# Patient Record
Sex: Female | Born: 1950 | Race: Black or African American | Hispanic: No | Marital: Married | Smoking: Never smoker
Health system: Southern US, Community
[De-identification: ages and names within clinical notes are randomized; demographics above are authoritative.]

## PROBLEM LIST (undated history)

## (undated) DIAGNOSIS — IMO0001 Reserved for inherently not codable concepts without codable children: Secondary | ICD-10-CM

## (undated) DIAGNOSIS — E785 Hyperlipidemia, unspecified: Secondary | ICD-10-CM

## (undated) DIAGNOSIS — I10 Essential (primary) hypertension: Secondary | ICD-10-CM

## (undated) DIAGNOSIS — K529 Noninfective gastroenteritis and colitis, unspecified: Secondary | ICD-10-CM

## (undated) DIAGNOSIS — K219 Gastro-esophageal reflux disease without esophagitis: Secondary | ICD-10-CM

## (undated) DIAGNOSIS — K859 Acute pancreatitis without necrosis or infection, unspecified: Secondary | ICD-10-CM

## (undated) HISTORY — DX: Acute pancreatitis without necrosis or infection, unspecified: K85.90

## (undated) HISTORY — DX: Reserved for inherently not codable concepts without codable children: IMO0001

## (undated) HISTORY — DX: Noninfective gastroenteritis and colitis, unspecified: K52.9

## (undated) HISTORY — DX: Gastro-esophageal reflux disease without esophagitis: K21.9

## (undated) HISTORY — DX: Hyperlipidemia, unspecified: E78.5

---

## 1998-09-14 ENCOUNTER — Other Ambulatory Visit: Admission: RE | Admit: 1998-09-14 | Discharge: 1998-09-14 | Payer: Self-pay | Admitting: Obstetrics and Gynecology

## 1998-10-25 ENCOUNTER — Ambulatory Visit (HOSPITAL_COMMUNITY): Admission: RE | Admit: 1998-10-25 | Discharge: 1998-10-25 | Payer: Self-pay | Admitting: Obstetrics and Gynecology

## 1998-10-25 ENCOUNTER — Encounter (INDEPENDENT_AMBULATORY_CARE_PROVIDER_SITE_OTHER): Payer: Self-pay

## 2000-09-09 ENCOUNTER — Other Ambulatory Visit: Admission: RE | Admit: 2000-09-09 | Discharge: 2000-09-09 | Payer: Self-pay | Admitting: Obstetrics & Gynecology

## 2001-01-26 ENCOUNTER — Encounter: Admission: RE | Admit: 2001-01-26 | Discharge: 2001-03-27 | Payer: Self-pay | Admitting: Family Medicine

## 2001-03-31 ENCOUNTER — Encounter: Admission: RE | Admit: 2001-03-31 | Discharge: 2001-03-31 | Payer: Self-pay | Admitting: Family Medicine

## 2001-03-31 ENCOUNTER — Encounter: Payer: Self-pay | Admitting: Family Medicine

## 2001-09-21 ENCOUNTER — Inpatient Hospital Stay (HOSPITAL_COMMUNITY): Admission: EM | Admit: 2001-09-21 | Discharge: 2001-09-26 | Payer: Self-pay | Admitting: Emergency Medicine

## 2001-09-21 ENCOUNTER — Encounter: Payer: Self-pay | Admitting: Emergency Medicine

## 2001-09-22 ENCOUNTER — Encounter: Payer: Self-pay | Admitting: General Surgery

## 2001-09-23 ENCOUNTER — Encounter: Payer: Self-pay | Admitting: General Surgery

## 2001-10-09 ENCOUNTER — Encounter: Payer: Self-pay | Admitting: Gastroenterology

## 2001-10-09 ENCOUNTER — Encounter: Admission: RE | Admit: 2001-10-09 | Discharge: 2001-10-09 | Payer: Self-pay | Admitting: Gastroenterology

## 2001-11-18 ENCOUNTER — Other Ambulatory Visit: Admission: RE | Admit: 2001-11-18 | Discharge: 2001-11-18 | Payer: Self-pay | Admitting: Obstetrics & Gynecology

## 2002-08-12 ENCOUNTER — Encounter: Admission: RE | Admit: 2002-08-12 | Discharge: 2002-08-12 | Payer: Self-pay | Admitting: Family Medicine

## 2002-08-12 ENCOUNTER — Encounter: Payer: Self-pay | Admitting: Family Medicine

## 2002-12-08 ENCOUNTER — Other Ambulatory Visit: Admission: RE | Admit: 2002-12-08 | Discharge: 2002-12-08 | Payer: Self-pay | Admitting: Obstetrics & Gynecology

## 2004-01-13 ENCOUNTER — Other Ambulatory Visit: Admission: RE | Admit: 2004-01-13 | Discharge: 2004-01-13 | Payer: Self-pay | Admitting: Obstetrics & Gynecology

## 2004-02-08 ENCOUNTER — Encounter: Admission: RE | Admit: 2004-02-08 | Discharge: 2004-02-08 | Payer: Self-pay | Admitting: Family Medicine

## 2005-01-14 ENCOUNTER — Other Ambulatory Visit: Admission: RE | Admit: 2005-01-14 | Discharge: 2005-01-14 | Payer: Self-pay | Admitting: Obstetrics & Gynecology

## 2005-04-25 ENCOUNTER — Observation Stay (HOSPITAL_COMMUNITY): Admission: EM | Admit: 2005-04-25 | Discharge: 2005-04-26 | Payer: Self-pay | Admitting: Emergency Medicine

## 2005-05-29 ENCOUNTER — Encounter: Admission: RE | Admit: 2005-05-29 | Discharge: 2005-05-29 | Payer: Self-pay | Admitting: Gastroenterology

## 2006-06-13 ENCOUNTER — Encounter: Admission: RE | Admit: 2006-06-13 | Discharge: 2006-06-13 | Payer: Self-pay | Admitting: Obstetrics & Gynecology

## 2006-11-27 ENCOUNTER — Emergency Department (HOSPITAL_COMMUNITY): Admission: EM | Admit: 2006-11-27 | Discharge: 2006-11-27 | Payer: Self-pay | Admitting: Family Medicine

## 2007-10-30 ENCOUNTER — Encounter: Admission: RE | Admit: 2007-10-30 | Discharge: 2007-10-30 | Payer: Self-pay | Admitting: Internal Medicine

## 2007-10-30 IMAGING — CT CT PARANASAL SINUSES LIMITED
1 series · 16 of 24 positions shown, 20 images · non-contrast
Comparison: None

CLINICAL DATA: Persistent symptoms of sinusitis, left sided
headache, left eye pain, vertigo

CT PARANASAL SINUSES WITHOUT CONTRAST
TECHNIQUE: Multidetector CT through the paranasal sinuses was
performed using the standard protocol without intravenous contrast.

[Series 2: limited sinus prone · axial · 0.33mm/px · z∈[+58,+143]mm · 16 of 24 slices shown, 20 images]
[im 2/24  brain]
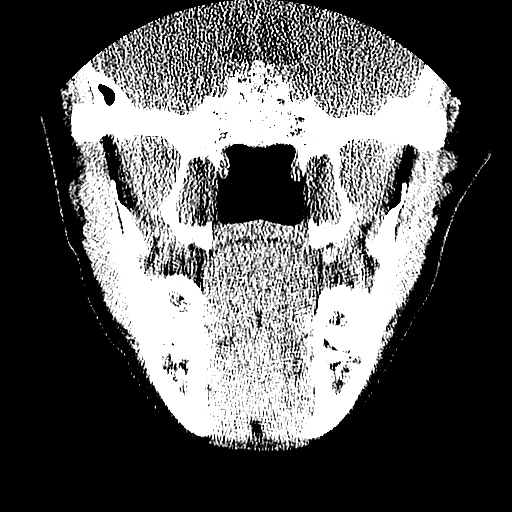
[im 2/24  bone]
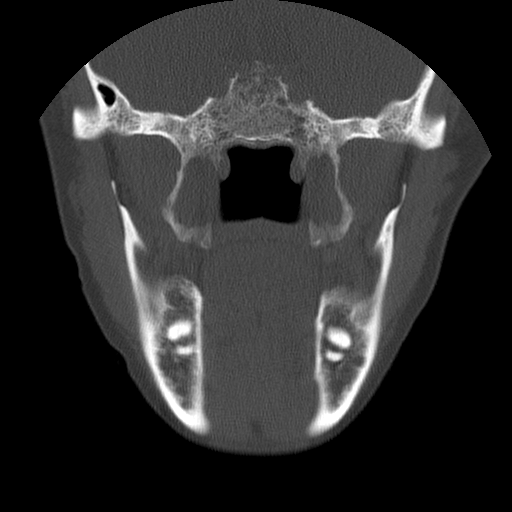
[im 4/24  bone]
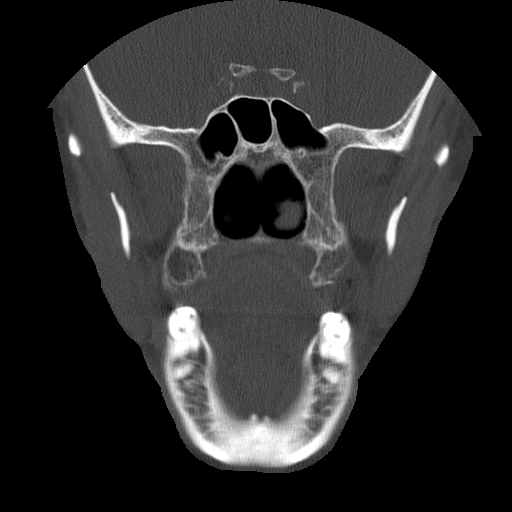
[im 5/24  bone]
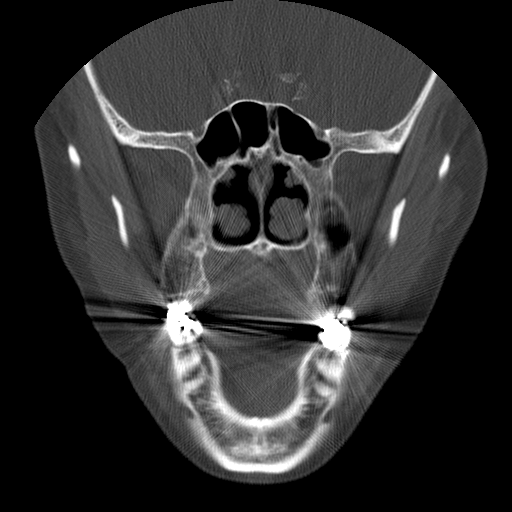
[im 6/24  bone]
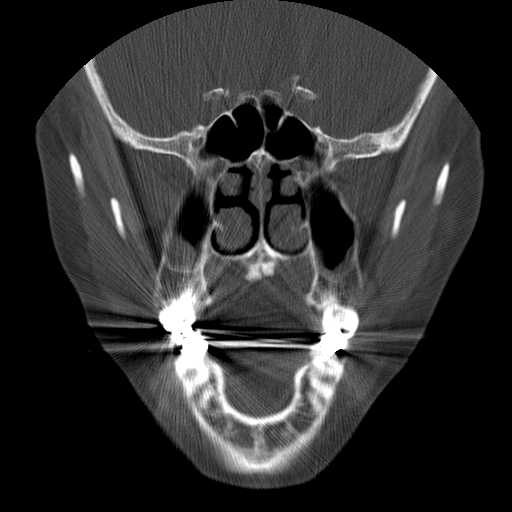
[im 8/24  brain]
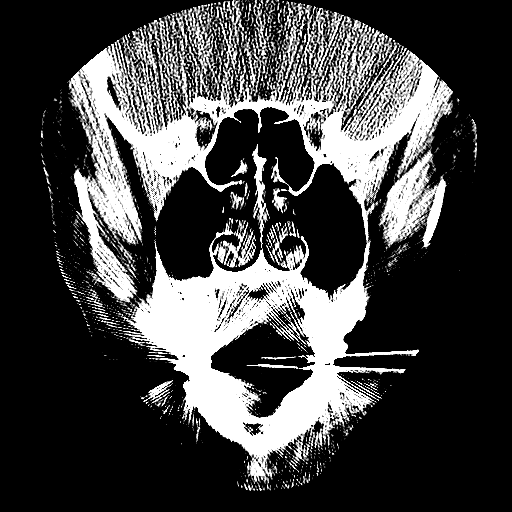
[im 8/24  bone]
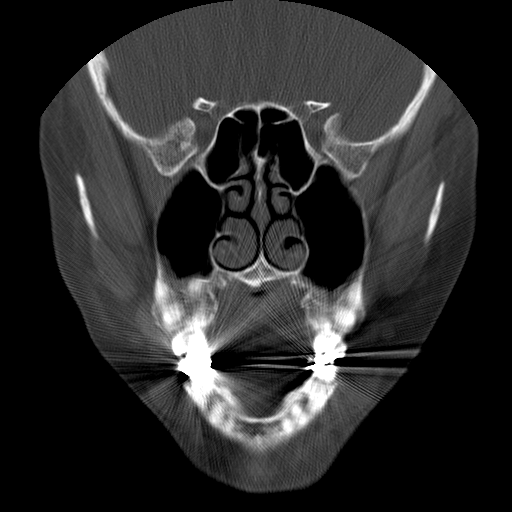
[im 9/24  bone]
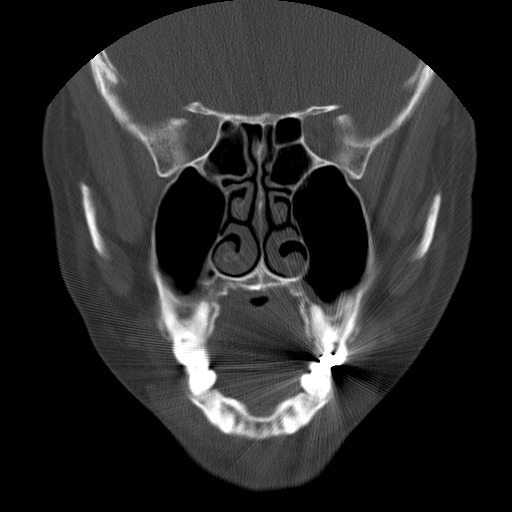
[im 10/24  bone]
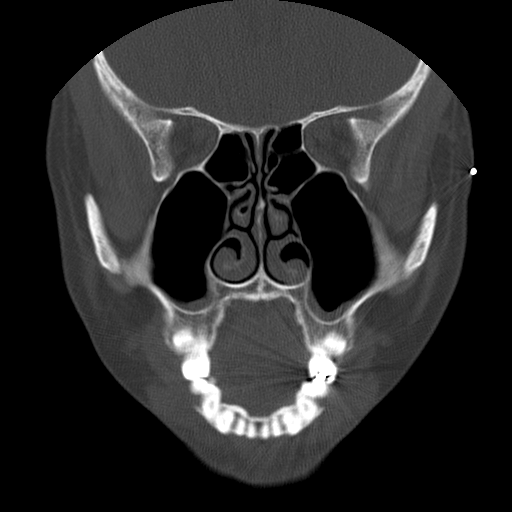
[im 12/24  bone]
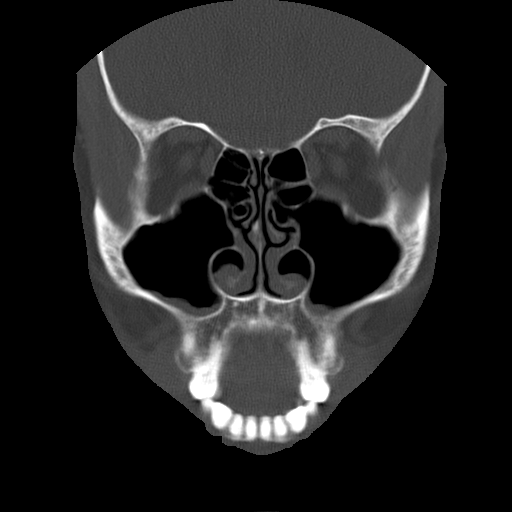
[im 13/24  brain]
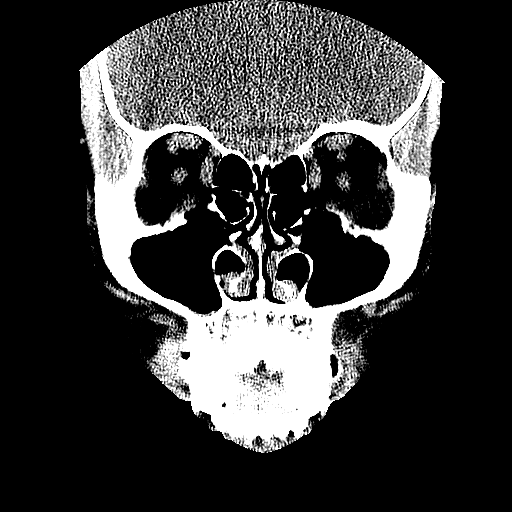
[im 13/24  bone]
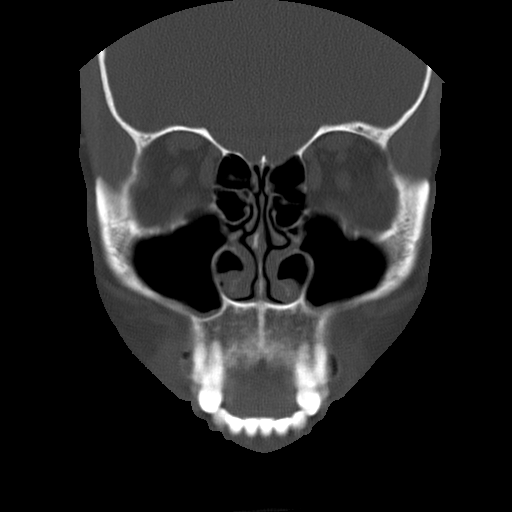
[im 15/24  bone]
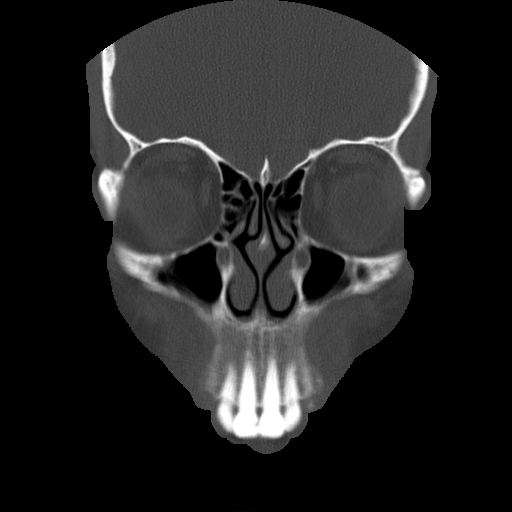
[im 16/24  bone]
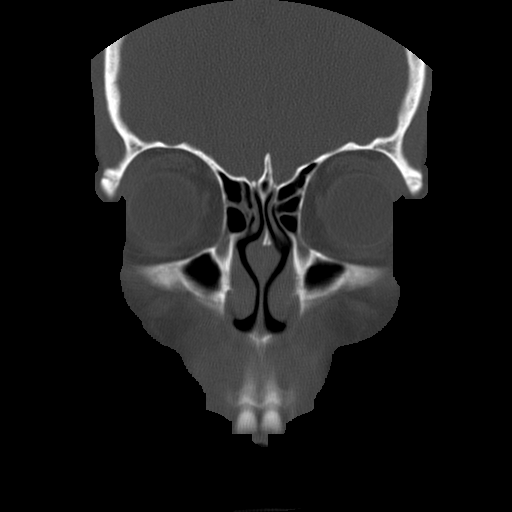
[im 17/24  bone]
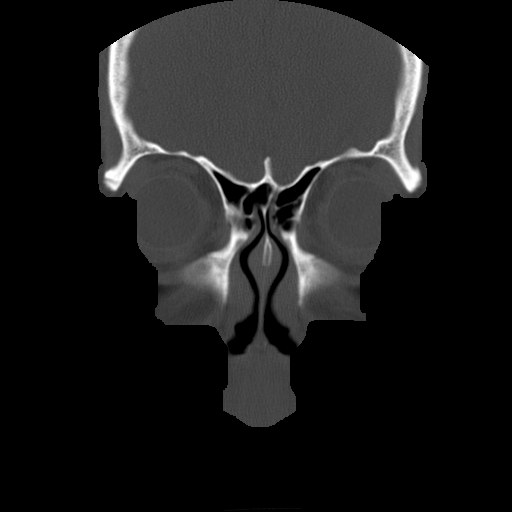
[im 19/24  brain]
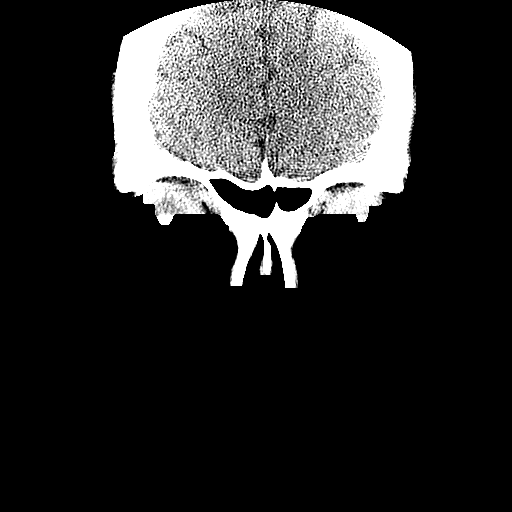
[im 19/24  bone]
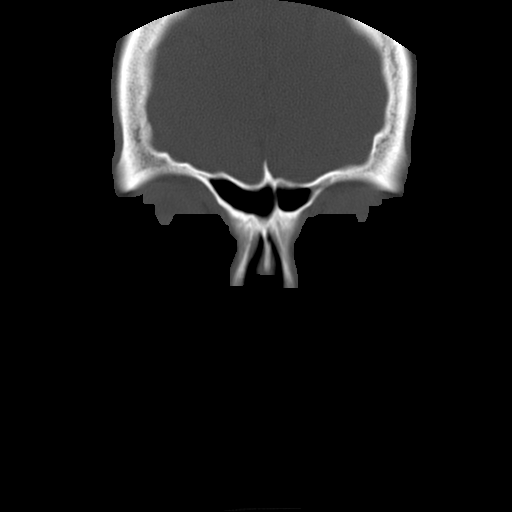
[im 20/24  bone]
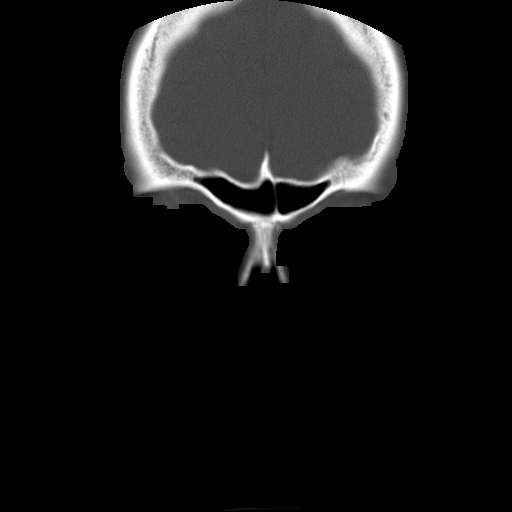
[im 21/24  bone]
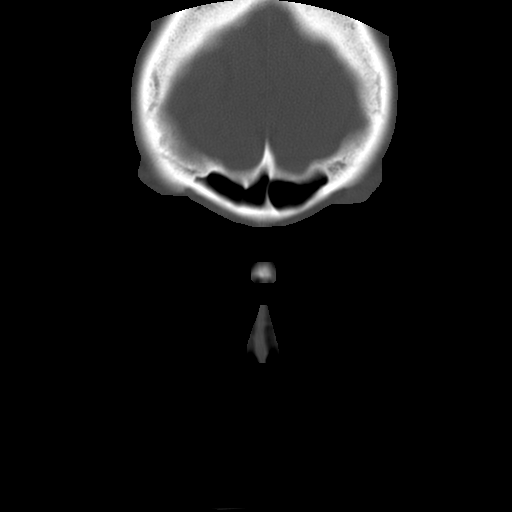
[im 23/24  bone]
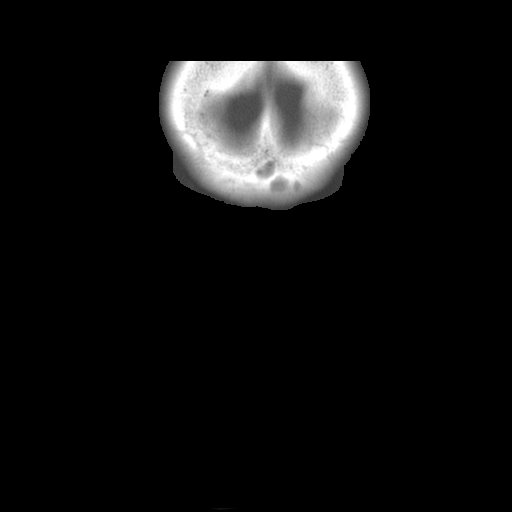

[16 of 24 positions shown; findings below may reference images not displayed]

FINDINGS: There is mild mucosal thickening in the floor of both
maxillary sinuses.  However no air-fluid level is seen.  The
infundibuli are not optimally seen on this limited exam but appear
patent.  The remainder the paranasal sinuses are well pneumatized.
Nasal turbinates are normal in size and the nasal airway is patent.
No significant nasal septal deviation is seen.
IMPRESSION: Mild mucosal thickening in the floor of both maxillary sinuses.  No
present sinusitis.

## 2008-06-27 ENCOUNTER — Emergency Department (HOSPITAL_COMMUNITY): Admission: EM | Admit: 2008-06-27 | Discharge: 2008-06-27 | Payer: Self-pay | Admitting: Family Medicine

## 2010-06-17 ENCOUNTER — Inpatient Hospital Stay (HOSPITAL_COMMUNITY)
Admission: EM | Admit: 2010-06-17 | Discharge: 2010-06-19 | DRG: 551 | Disposition: A | Discharge status: Home Health | Payer: BC Managed Care – PPO | Attending: Internal Medicine | Admitting: Internal Medicine

## 2010-06-17 ENCOUNTER — Emergency Department (HOSPITAL_COMMUNITY): Payer: BC Managed Care – PPO

## 2010-06-17 DIAGNOSIS — I951 Orthostatic hypotension: Secondary | ICD-10-CM | POA: Diagnosis present

## 2010-06-17 DIAGNOSIS — E785 Hyperlipidemia, unspecified: Secondary | ICD-10-CM | POA: Diagnosis present

## 2010-06-17 DIAGNOSIS — Z932 Ileostomy status: Secondary | ICD-10-CM

## 2010-06-17 DIAGNOSIS — A088 Other specified intestinal infections: Principal | ICD-10-CM | POA: Diagnosis present

## 2010-06-17 DIAGNOSIS — E860 Dehydration: Secondary | ICD-10-CM | POA: Diagnosis present

## 2010-06-17 DIAGNOSIS — N179 Acute kidney failure, unspecified: Secondary | ICD-10-CM | POA: Diagnosis present

## 2010-06-17 DIAGNOSIS — I10 Essential (primary) hypertension: Secondary | ICD-10-CM | POA: Diagnosis present

## 2010-06-17 DIAGNOSIS — Z79899 Other long term (current) drug therapy: Secondary | ICD-10-CM

## 2010-06-17 DIAGNOSIS — Z9049 Acquired absence of other specified parts of digestive tract: Secondary | ICD-10-CM

## 2010-06-17 HISTORY — DX: Essential (primary) hypertension: I10

## 2010-06-17 LAB — COMPREHENSIVE METABOLIC PANEL
ALT: 37 U/L — ABNORMAL HIGH (ref 0–35)
Alkaline Phosphatase: 85 U/L (ref 39–117)
BUN: 29 mg/dL — ABNORMAL HIGH (ref 6–23)
Calcium: 10.7 mg/dL — ABNORMAL HIGH (ref 8.4–10.5)
Creatinine, Ser: 2.31 mg/dL — ABNORMAL HIGH (ref 0.4–1.2)
Sodium: 139 mEq/L (ref 135–145)
Total Protein: 9.8 g/dL — ABNORMAL HIGH (ref 6.0–8.3)

## 2010-06-17 LAB — URINALYSIS, ROUTINE W REFLEX MICROSCOPIC
Hgb urine dipstick: NEGATIVE
Protein, ur: 100 mg/dL — AB
Specific Gravity, Urine: 1.027 (ref 1.005–1.030)
Urobilinogen, UA: 0.2 mg/dL (ref 0.0–1.0)
pH: 5.5 (ref 5.0–8.0)

## 2010-06-17 LAB — DIFFERENTIAL
Lymphocytes Relative: 10 % — ABNORMAL LOW (ref 12–46)
Monocytes Absolute: 0.8 10*3/uL (ref 0.1–1.0)
Monocytes Relative: 6 % (ref 3–12)
Neutro Abs: 10.7 10*3/uL — ABNORMAL HIGH (ref 1.7–7.7)
Neutrophils Relative %: 84 % — ABNORMAL HIGH (ref 43–77)

## 2010-06-17 LAB — CBC
HCT: 43.7 % (ref 36.0–46.0)
MCHC: 35.5 g/dL (ref 30.0–36.0)
MCV: 90.3 fL (ref 78.0–100.0)
Platelets: 277 10*3/uL (ref 150–400)
RDW: 13.8 % (ref 11.5–15.5)

## 2010-06-17 LAB — URINE MICROSCOPIC-ADD ON

## 2010-06-18 ENCOUNTER — Inpatient Hospital Stay (HOSPITAL_COMMUNITY): Payer: BC Managed Care – PPO

## 2010-06-18 ENCOUNTER — Encounter (HOSPITAL_COMMUNITY): Payer: Self-pay | Admitting: Radiology

## 2010-06-18 LAB — COMPREHENSIVE METABOLIC PANEL
AST: 32 U/L (ref 0–37)
Calcium: 9.6 mg/dL (ref 8.4–10.5)
Chloride: 109 mEq/L (ref 96–112)
Creatinine, Ser: 2.71 mg/dL — ABNORMAL HIGH (ref 0.4–1.2)
GFR calc non Af Amer: 18 mL/min — ABNORMAL LOW (ref >60)
Sodium: 138 mEq/L (ref 135–145)

## 2010-06-18 LAB — CBC
MCHC: 33.9 g/dL (ref 30.0–36.0)
MCV: 90 fL (ref 78.0–100.0)
RBC: 4.22 MIL/uL (ref 3.87–5.11)
RDW: 14 % (ref 11.5–15.5)
WBC: 7.7 10*3/uL (ref 4.0–10.5)

## 2010-06-18 LAB — CARDIAC PANEL(CRET KIN+CKTOT+MB+TROPI)
CK, MB: 6.8 ng/mL (ref 0.3–4.0)
Relative Index: 3.8 — ABNORMAL HIGH (ref 0.0–2.5)
Total CK: 159 U/L (ref 7–177)
Troponin I: 0.01 ng/mL (ref 0.00–0.06)

## 2010-06-19 LAB — CBC
HCT: 33.6 % — ABNORMAL LOW (ref 36.0–46.0)
Hemoglobin: 11.3 g/dL — ABNORMAL LOW (ref 12.0–15.0)
MCV: 91.3 fL (ref 78.0–100.0)
Platelets: 224 10*3/uL (ref 150–400)
WBC: 6 10*3/uL (ref 4.0–10.5)

## 2010-06-19 LAB — CLOSTRIDIUM DIFFICILE BY PCR: Toxigenic C. Difficile by PCR: NEGATIVE

## 2010-06-19 LAB — BASIC METABOLIC PANEL
BUN: 24 mg/dL — ABNORMAL HIGH (ref 6–23)
Calcium: 8.5 mg/dL (ref 8.4–10.5)
GFR calc Af Amer: >60 mL/min (ref >60)
GFR calc non Af Amer: 51 mL/min — ABNORMAL LOW (ref >60)
Potassium: 4.7 mEq/L (ref 3.5–5.1)
Sodium: 136 mEq/L (ref 135–145)

## 2010-06-20 LAB — GIARDIA/CRYPTOSPORIDIUM SCREEN(EIA)
Cryptosporidium Screen (EIA): NEGATIVE
Giardia Screen - EIA: NEGATIVE

## 2010-06-23 LAB — STOOL CULTURE

## 2010-06-28 NOTE — Discharge Summary (Signed)
NAMEBRYCELYNN, [name]               ACCOUNT NO.:  [id number]  MEDICAL RECORD NO.:  [id number]           PATIENT TYPE:  I  LOCATION:  4714                         FACILITY:  [facility]  PHYSICIAN:  Peggye Pitt, M.D. DATE OF BIRTH:  [birth year]  DATE OF ADMISSION:  06/17/2010 DATE OF DISCHARGE:  06/19/2010                              DISCHARGE SUMMARY   PRIMARY CARE PHYSICIAN:  Cala Bradford R. Renae Gloss, MD  DISCHARGE DIAGNOSES: 1. Nausea, vomiting and increased ileostomy output, likely secondary     to viral gastroenteritis. 2. Syncope secondary to orthostatic hypotension. 3. Orthostatic hypotension. 4. Acute renal failure, resolved. 5. History of ulcerative colitis status post total colectomy in 1984. 6. Hypertension. 7. Hyperlipidemia.  DISCHARGE MEDICATIONS: 1. Calcium carbonate plus vitamin D 1 tablet daily. 2. Crestor 10 mg at bedtime. 3. Nortriptyline 25 mg at bedtime. 4. Progesterone 50 mg capsule 1 tablet twice daily. 5. Protonix 40 mg twice daily. 6. Zyrtec 10 mg daily. 7. She has been instructed to discontinue the use of Candesartan until     seen by Dr. Renae Gloss.  DISPOSITION AND FOLLOWUP:  Natalie Boyd will be discharged home today in stable and improved condition.  She is instructed to drink plenty of fluids and to return to the emergency department if she notes significant increase of her ostomy output.  She also need to schedule a followup appointment with Dr. Renae Gloss in approximately 2 weeks.  CONSULTATION THIS HOSPITALIZATION:  None.  IMAGES AND PROCEDURES: 1. An acute abdominal series on March 4 that showed a nonobstructive     bowel gas pattern with no acute cardiopulmonary disease. 2. A CT scan of the abdomen and pelvis on March 5 that showed no     evidence of acute abdominal process with a low density lesion     within the liver slightly larger than the CT from 2011 but is     likely benign.  HISTORY AND PHYSICAL:  For full details, please see dictation  on March 4 by Dr. Susie Cassette, but in brief, Natalie Boyd is a very pleasant 60 year old African American lady with a history of ulcerative colitis who is status post a total colectomy at [facility] in [place] in 1984 who presented with nausea, vomiting and an increase in her ostomy output for about 2 days prior to admission.  She subsequently had a syncopal episode when she stood up, went to her bathroom and on her way back pass out.  Because of this her husband brought her into the emergency department where she was found to be clinically dehydrated.  HOSPITAL COURSE BY PROBLEM: 1. Acute viral gastroenteritis.  We believe that this is the most     likely diagnosis for her symptoms.  This has resolved spontaneously     within the course of 24 hours in the hospital.  We have given her     copious amounts of IV fluids.  She still remains orthostatic by     pulse parameter and not by blood pressure.  However she has stood     up and walked on the hallways and has no dizziness or  lightheadedness.  She feels ready for discharge home today.  She     will need to follow up with Dr. Renae Gloss in 2 weeks or if her ostomy     output increases significantly. 2. Hypertension.  She was on Atacand.  Given her low blood pressures     currently and the fact that she is still orthostatic by pulse data,     I will discontinue this for now.  Dr. Renae Gloss will make a decision     upon followup appointment whether or not to restart it. 3. Rest of her chronic conditions are stable. 4. Vitals on day of discharge; blood pressure 100/61, heart rate 89,     respirations 20, sats of 100% on room air and a temperature of     97.9.     Peggye Pitt, M.D.     EH/MEDQ  D:  06/19/2010  T:  06/19/2010  Job:  [id number]  cc:   Merlene Laughter. Renae Gloss, M.D.  Electronically Signed by Peggye Pitt M.D. on 06/28/2010 05:42:25 PM

## 2010-07-21 NOTE — H&P (Signed)
Natalie Boyd, Natalie Boyd               ACCOUNT NO.:  [id number]  MEDICAL RECORD NO.:  [id number]           PATIENT TYPE:  E  LOCATION:  [facility]                         FACILITY:  [facility]  PHYSICIAN:  Richarda Overlie, MD       DATE OF BIRTH:  [birth year]  DATE OF ADMISSION:  06/17/2010 DATE OF DISCHARGE:                             HISTORY & PHYSICAL   PRIMARY CARE PHYSICIAN:  Merlene Laughter. Renae Gloss, MD  CHIEF COMPLAINT:  Diarrhea, nausea, vomiting.  SUBJECTIVE:  This is a 60 year old female with a history of ulcerative colitis status post colectomy in 1984 and long-standing ileostomy who has done fairly well who presented with intractable nausea, vomiting, diarrhea starting at around 2-3 p.m. yesterday.  The patient has had multiple episodes of vomiting associated with multiple times when she has had to empty her ileostomy bag.  She typically empties it out 6 times a day, but over the course of the last 24 hours the patient states that she has emptied it out multiple times.  Initially, the stool in the bag was watery, but over the last 6-7 hours it has become completely transparent.  The patient is also clinically dehydrated.  She states that at 12 noon today the patient had a syncopal episode during an episode of nausea, vomiting, and was found on the floor by her husband. She denied any chest pain, dizziness, or any other premonition symptoms prior to the fall.  The patient was found to be quite dehydrated and orthostatic in the ED and is being admitted for further evaluation.  PAST MEDICAL HISTORY:  History of ulcerative colitis, previously managed by [facility] gastroenterologist; however, she relocated in 1990 and was followed by Dr. Chesley Mires, a gastroenterologist in [facility], currently her gastroenterologist is Dr. Ewing Schlein.  She is status post total colectomy in 1984 and has done quite well.  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  History of hypertension, dyslipidemia, status  post tonsillectomy, history of pancreatitis in 1970.  CURRENT MEDICATIONS:  Protonix, promethazine, nortriptyline, Leveron, progesterone, vitamin D, Crestor, and Atacand.  ALLERGIES:  PENICILLIN, SULFA, and CODEINE.  SOCIAL HISTORY:  The patient lives in [place] with her husband. Denies any tobacco or alcohol use.  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  FAMILY HISTORY:  Father died in the 73s with complications from heart disease.  He had a first myocardial infarction at the age of 46.  Mother is alive and well.  REVIEW OF SYSTEMS:  Complete review of systems was done as documented in the HPI.  There are no complaints or history of any fever, chills, or rigors at home.  PHYSICAL EXAMINATION:  VITAL SIGNS:  Blood pressure currently 102/61, pulse of 107, respirations 15.  The patient was also hypotensive in the ED with blood pressure of 78/46 recorded at around 1800. GENERAL:  Currently, the patient is alert, awake, comfortable in no acute cardiopulmonary distress. HEENT:  Pupils equal and reactive.  Extraocular movements intact. NECK:  Supple.  No JVD. LUNGS:  Clear to auscultation bilaterally.  No wheezes or crackles or rhonchi. CARDIOVASCULAR:  Regular rate and rhythm.  No murmurs, rubs, or gallops. ABDOMEN:  Soft, hyperactive bowel sounds with an ileostomy back in the right lower quadrant which is filled with watery, greenish-appearing liquid. NEUROLOGIC:  Cranial nerves II through XII grossly intact. PSYCHIATRIC:  Appropriate mood and affect.  LABORATORY DATA: 1. Abdominal KUB shows nonobstructive bowel gas pattern. 2. Lipase of 24.  CMP, sodium 139, potassium 4.2, chloride 105, bicarb     20, glucose 132, BUN 29, creatinine 2.31.  Total bilirubin is 0.5,     AST of 43, ALT 37, calcium of 10.7. 3. CBC, WBC 12.8, hemoglobin 15.5, hematocrit 43.7, and platelet count     of 277,000.  ASSESSMENT AND PLAN: 1. Likely acute gastroenteritis. 2. Orthostatic hypotension secondary to acute  gastroenteritis. 3. Acute renal insufficiency secondary to dehydration. 4. Syncopal episode likely secondary to significant orthostatic     hypotension.  PLAN:  The patient will be admitted to the telemetry floor.  Given her complicated history of abdominal surgeries, we will obtain a CT scan of the abdomen and pelvis to rule out any other underlying process.  The patient will empirically be started on ciprofloxacin and Flagyl.  We will send off stool studies for C. diff, PCR, ova and parasites, culture and sensitivity.  We will hold her antihypertensive medications and hydrate her aggressively with IV fluids.  The patient's syncopal episode was likely secondary to her significant orthostasis which was quite evident when the patient presented to the ED; however, given her history of hypertension we will place on telemetry.  We will cycle cardiac enzymes.  We will also obtain a 2-D echo.  If the patient's symptoms resolve, she may be discharged in 1-2 days.     Richarda Overlie, MD     NA/MEDQ  D:  06/17/2010  T:  06/17/2010  Job:  [id number]  Electronically Signed by Richarda Overlie MD on 07/21/2010 08:37:12 PM

## 2010-08-31 NOTE — Discharge Summary (Signed)
[facility]  Patient:    Natalie Boyd, Natalie Boyd Visit Number: [id number] MRN: [id number]          Service Type: MED Location: [id number] Attending Physician:  Caleen Essex Dictated by:   Ollen Gross. Vernell Morgans, M.D. Admit Date:  09/21/2001 Discharge Date: 09/26/2001                             Discharge Summary  HISTORY OF PRESENT ILLNESS:  The patient is a 60 year old, African-American female with a history of ulcerative colitis and prior abdominoperineal resection with permanent ileostomy who presented initially with abdominal pain and vomiting.  She had radiologic findings consistent with a partial small bowel obstruction.  HOSPITAL COURSE:  She was treated with bowel rest and hydration.  Over the next several days, she showed some significant improvement.  She was eventually started on a diet and slowly advanced as she tolerated.  By June 14, she was ready for discharge home.  CONDITION ON DISCHARGE:  Stable.  DISCHARGE MEDICATIONS:  Resume home medications.  DIET:  As tolerated.  DISCHARGE DIAGNOSIS:  Partial small bowel obstruction, resolved.  FOLLOWUP:  Follow up as needed with the surgical service and referrals will be made to the gastroenterologist to follow her for ulcerative colitis.  During her hospitalization, she asked to have a second opinion which was done by Dr. Johna Sheriff who agreed with her initial evaluation and assessment.  On June 14, she was discharged to home. Dictated by:   Ollen Gross. Vernell Morgans, M.D. Attending Physician:  Caleen Essex DD:  11/02/01 TD:  11/08/01 Job: [phone] [id number]

## 2010-08-31 NOTE — H&P (Signed)
NAMEJARELIS, Natalie Boyd               ACCOUNT NO.:  [id number]   MEDICAL RECORD NO.:  [id number]          PATIENT TYPE:  INP   LOCATION:  0102                         [facility]ACILITY:  [facility]   PHYSICIAN:  Hettie Holstein, D.O.    DATE OF BIRTH:  08-Aug-1950   DATE OF ADMISSION:  04/25/2005  DATE OF DISCHARGE:                                HISTORY & PHYSICAL   CHIEF COMPLAINT:  Stomach cramping and pain and near-syncopal episode.   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 In any  event, today she was walking to her bathroom at home to empty her ileostomy  bag as she had been having increased ostomy output with very liquid, watery  output and subsequently upon returning she much worsening of her mid  epigastric cramping pain, stated that she was hunched over. Her husband  witnessed the episode and saw her stumbling around in the room and  subsequently she fell onto some pillows on the floor. In any event, he  walked over towards her and was assessing her. Her eyes were open and she  was breathing, though she felt clammy and wet. She was not responsive to  verbal though she was breathing. Her husband went to call his daughter to  make plans to bring her to the hospital. All of this did resolve within 5  minutes. She continued to have the abdominal pain but she did have an  episode of emesis and felt better afterwards.   In any event, in the emergency department she was assessed, underwent  ultrasound of the abdomen which was unrevealing. She was noted to have some  mildly elevated LFTs in the department with an AST of 78 and an ALT of 66.  All other laboratory data was  normal.   PAST MEDICAL HISTORY:  1.  As noted above, significant for ulcerative colitis, previously managed      by [facility] gastroenterologist; however, she has relocated here      since 1990 and has followed by Dr. Steele Sizer, a gastroenterologist at      [facility]. She has had an endoscopy in August and has not been      reported abnormal findings. These records are not available at this time      as they are at [facility]. She did have a CT scan through her primary      care physician in October that was without IV contrast and normal      results were conveyed to her. She was told she had a pancreatic rest      though I am not certain of this diagnosis at this time.  2.  History of hypertension.  3.  Hypercholesterolemia.  4.  Status post tonsillectomy.  5.  History  of pancreatitis in the 1970s that she attributed to Azulfidine      that she was receiving for her ulcerative colitis.   MEDICATIONS:  1.  Carafate t.i.d. to q.i.d.  2.  Protonix 40 mg daily.  3.  Lipitor 20 mg daily.  4.  Atacand - she is uncertain of the dose but feels it may be 16 mg daily.   ALLERGIES:  Include PENICILLIN and SULFA.   SOCIAL HISTORY:  Originally she is from [place]. They have one adopted  child. She denies tobacco or alcohol. 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   FAMILY HISTORY:  Her father died in his 33s with heart complications. He had  his first myocardial infarction at age 33. Mother is alive and well at age  22.   REVIEW OF SYSTEMS:  She had some liquid ileostomy output with increased  output recently. She has had no fevers, does report some chills. She has had  no chest pain or shortness of breath. Has had some occasional swelling of  her lower extremities. No dysuria. Otherwise, further review of systems is  unremarkable.   PHYSICAL EXAMINATION:  VITAL SIGNS:  Revealed a temperature of 98.4, blood  pressure of 102/68, heart rate of 66, respirations 20, O2 saturation 97%.  GENERAL:  The  patient is alert, oriented, pleasant, quite articulate, in no  acute distress.  NECK:  Supple, nontender. No palpable thyromegaly or mass.  CARDIOVASCULAR:  Reveals normal S1, S2, without S3 or S4.  LUNGS:  Clear to auscultation bilaterally with normal effort and no dullness  to percussion.  ABDOMEN:  Reveals a midline surgical incision and ileostomy with cloudy  output, scant. Abdomen reveals no rebound or rigidity. Her bowel sounds are  hypoactive.  EXTREMITIES:  Reveal no edema.  NEUROLOGIC:  Reveals the patient to be euthymic, her affect is stable. There  are no focal neurologic deficits.   LABORATORY DATA:  Reveal her INR to 1.1. Lipase 35. Sodium 136, potassium  4.0, BUN 11, creatinine of 1.2, glucose of 94. Her AST was 78 and ALT was  66, albumin was 3.9. Her MCV was 92. Abdominal ultrasound was normal. EKG  revealed normal sinus rhythm.   ASSESSMENT:  1.  Intractable abdominal pain.  2.  Status post colectomy with ileostomy.  3.  Elevated liver function tests.  4.  Hypertension.   PLAN:  At this time we are going to order a CT scan on Natalie Boyd to  evaluate the etiology of her complaints. She will be placed on 23-hour  observation status and placed on clear liquids. She is currently  asymptomatic at this time.  We will administer antiemetics and antispasmodics for now, hold her Atacand  and Lipitor as her pressures are on the low side. We will follow her  clinical course. Perhaps she needs a local gastroenterologist as she states  she would be open to this as hers is at [facility].      Hettie Holstein, D.O.  Electronically Signed     ESS/MEDQ  D:  04/25/2005  T:  04/25/2005  Job:  [id number]   cc:   Dr. Steele Sizer at [facility] R. Renae Gloss, M.D.  Fax: [phone]   Petra Kuba, M.D.  Fax: [phone]

## 2010-08-31 NOTE — Discharge Summary (Signed)
Natalie Boyd, Natalie Boyd               ACCOUNT NO.:  [id number]   MEDICAL RECORD NO.:  [id number]          PATIENT TYPE:  INP   LOCATION:  1621                         [facility]ACILITY[facility]   PHYSICIAN:  Nelma Rothman, MD   DATE OF BIRTH:  [birth year]   DATE OF ADMISSION:  04/25/2005  DATE OF DISCHARGE:  04/26/2005                                 DISCHARGE SUMMARY   PRIMARY CARE PHYSICIAN:  Dr. Andi Devon   DISCHARGE DIAGNOSES:  1.  Acute gastroenteritis.  2.  Ulcerative colitis, status post colectomy  3.  History of hyperlipidemia.   PROCEDURES:  1.  Abdominal ultrasound on January11 demonstrated no acute or significant      findings.  Her gallbladder and biliary tree were normal.  Liver, spleen,      and pancreas were also within normal limits.  2.  A CT of the abdomen and pelvis with contrast on January11 demonstrated      no acute abdominal findings status post colectomy.  There were some      small low density liver lesions noted inferiorly in the right lobe of      the liver but were present on a prior abdominal CT from June2003 and are      unchanged.  They are likely deposits of local fat or benign lesions per      radiology report.  No suspicious liver lesions.   LABORATORY DATA:  White blood cell count 4.4, hemoglobin 13.3, hematocrit  39.4, and platelet count 225.  On the morning of discharge, sodium 138,  potassium 4.2, creatinine 1.1.  Liver function tests on admission  demonstrated total bilirubin of 0.3, alkaline phosphatase 129.  AST was  mildly elevated at 78.  ALT was mildly elevated at 66, lipase 35, and TSH  within normal limits at 1.504.   HISTORY AND PHYSICAL:  Please see dictated admission history and physical  but briefly, Natalie Boyd is a 60 year old female with a history of ulcerative  colitis status post colectomy, who presented to the [facility] emergency  department with crampy abdominal pain, watery output from her ileostomy.   HOSPITAL COURSE:   Natalie Boyd was admitted to the hospital to the medical  unit for IV fluid rehydration and treated for presumed acute  gastroenteritis.  An abdominal ultrasound which was performed was completely  normal.  Also contrasted of the abdomen and pelvis was within normal limits.  The morning after admission, her abdominal pain had subsided, but she  complained of some burning in her throat, thought to be consistent with  reflux disease.  The patient had previously been instructed that she is  unable to take over-the-counter antacids secondary to their magnesium  content.  I have therefore suggested to her that she increase her Protonix  to twice daily until she follows up with her regular GI doctor.  On the  morning of discharge, she remained afebrile, and vital signs were within  normal limits.  Her electrolytes were also within normal limits.  She was  felt stable for discharge to home with follow-up with her primary care  physician next week as well as her GI doctor.  I have also given her the  phone number for the GI clinic here in case she wants a second opinion, as  she seems quite frustrated overall that no one can give her an etiology of  her continued GI complaints.  Specifically, she has been having occasional  abdominal pain for at least a year as well as problems with recurrent  reflux.  She states that this has been evaluated previously by a  gastroenterologist over at [facility], and workup has been nonrevealing.  Given  her history of ulcerative colitis, there was some consideration as to  whether or not she could have a component of Crohn disease.  However, she  has remained afebrile.  There has been no bloody output from her ileostomy.  Therefore, this acute episode was felt more consistent with gastroenteritis,  which is already resolving.  She was felt stable for discharge today with  close continued follow up with her GI doctor as well as her primary care  physician.   DISCHARGE  MEDICATIONS:  She is to resume all previous home medications with  the exception of increasing Protonix to 40 mg twice daily and will follow up  with her GI doctor.   DISCHARGE INSTRUCTIONS:  She will return to follow up with Dr. Renae Gloss  within the next week.  She will hold her Lipitor until that time secondary  to a mild transaminitis.  She should have her liver function tests rechecked  at Dr. Mathews Robinsons office next week to determine whether or not she should  restart taking a statin.      Nelma Rothman, MD  Electronically Signed     RAR/MEDQ  D:  04/26/2005  T:  04/28/2005  Job:  [id number]

## 2010-12-24 ENCOUNTER — Other Ambulatory Visit: Payer: Self-pay | Admitting: Obstetrics & Gynecology

## 2010-12-24 ENCOUNTER — Other Ambulatory Visit (HOSPITAL_BASED_OUTPATIENT_CLINIC_OR_DEPARTMENT_OTHER): Payer: Self-pay | Admitting: Obstetrics & Gynecology

## 2010-12-24 DIAGNOSIS — N644 Mastodynia: Secondary | ICD-10-CM

## 2010-12-26 ENCOUNTER — Other Ambulatory Visit: Payer: Self-pay | Admitting: Obstetrics & Gynecology

## 2010-12-26 ENCOUNTER — Ambulatory Visit
Admission: RE | Admit: 2010-12-26 | Discharge: 2010-12-26 | Disposition: A | Discharge status: Home / Self Care | Payer: BC Managed Care – PPO | Source: Ambulatory Visit | Attending: Obstetrics & Gynecology | Admitting: Obstetrics & Gynecology

## 2010-12-26 DIAGNOSIS — N644 Mastodynia: Secondary | ICD-10-CM

## 2012-04-16 ENCOUNTER — Other Ambulatory Visit: Payer: Self-pay | Admitting: Otolaryngology

## 2012-04-16 DIAGNOSIS — J329 Chronic sinusitis, unspecified: Secondary | ICD-10-CM

## 2012-04-21 ENCOUNTER — Ambulatory Visit
Admission: RE | Admit: 2012-04-21 | Discharge: 2012-04-21 | Disposition: A | Discharge status: Home / Self Care | Payer: BC Managed Care – PPO | Source: Ambulatory Visit | Attending: Otolaryngology | Admitting: Otolaryngology

## 2012-04-21 DIAGNOSIS — J329 Chronic sinusitis, unspecified: Secondary | ICD-10-CM

## 2012-04-30 ENCOUNTER — Other Ambulatory Visit: Payer: Self-pay | Admitting: Otolaryngology

## 2012-05-06 ENCOUNTER — Other Ambulatory Visit: Payer: Self-pay

## 2013-04-01 ENCOUNTER — Encounter: Payer: Self-pay | Admitting: Podiatrist

## 2013-04-01 ENCOUNTER — Ambulatory Visit (INDEPENDENT_AMBULATORY_CARE_PROVIDER_SITE_OTHER): Payer: BC Managed Care – PPO | Admitting: Podiatrist

## 2013-04-01 VITALS — BP 112/60 | HR 84 | Resp 12

## 2013-04-01 DIAGNOSIS — Q828 Other specified congenital malformations of skin: Secondary | ICD-10-CM

## 2013-04-01 NOTE — Progress Notes (Signed)
   Subjective:    Patient ID: Natalie Boyd, female    DOB: [birth year], 62 y.o.   MRN: [id number]  HPI Comments: LT FOOT GREAT TOENAIL IS HURTING ABOUT 5 MONTHS AND TRIED TO GET IT TRIM. BOTH FEET HAVE CALLUS AND NEED TO BE TRIM.''  patient presents today complaining about pain the left great toenail its currently been bothering her for about 5 months. She also has calluses bilateral feet which she also states needs to be trimmed. She saw Dr. Wynelle Cleveland extensively in the past.   Review of Systems  All other systems reviewed and are negative.       Objective:   Physical Exam Neurovascular status is intact. She has discomfort on the medial aspect the left great toenail the toenail itself resting against the skin on the medial aspect of the toenail and the cuticle is slightly thickened. She also has a Pinch callus on bilateral hallux. Submetatarsal 2 are deeply enucleated lesions with a ground glass appearance present consistent with poro keratoses. She also has a scar along the right fifth digit laterally her previous incision was created. In the past Dr. Wynelle Cleveland tried injection therapy with cortisone which was of no benefit.        Assessment & Plan:  Keratotic/porokeratotic lesions x2,  Plan: Excised the lesions with a #15 blade without complication. Recommended mederma p.m. for the scar on the fifth digit right hopefully this will help the discomfort. Also recommended some cuticle softener for the medial aspect of the toenail as the cuticle itself appears to be thick versus the nail itself being issue.

## 2013-04-01 NOTE — Patient Instructions (Signed)
Try Mederma PM for the scar on the baby toe of the right foot.  Also try cuticle remover along the edges of your toenail to help soften the skin there.

## 2013-05-28 ENCOUNTER — Other Ambulatory Visit: Payer: Self-pay | Admitting: Internal Medicine

## 2013-05-28 DIAGNOSIS — E049 Nontoxic goiter, unspecified: Secondary | ICD-10-CM

## 2013-05-31 ENCOUNTER — Ambulatory Visit
Admission: RE | Admit: 2013-05-31 | Discharge: 2013-05-31 | Disposition: A | Discharge status: Home / Self Care | Payer: BC Managed Care – PPO | Source: Ambulatory Visit | Attending: Internal Medicine | Admitting: Internal Medicine

## 2013-05-31 DIAGNOSIS — E049 Nontoxic goiter, unspecified: Secondary | ICD-10-CM

## 2013-07-28 ENCOUNTER — Other Ambulatory Visit: Payer: Self-pay | Admitting: Otolaryngology

## 2013-07-28 DIAGNOSIS — E041 Nontoxic single thyroid nodule: Secondary | ICD-10-CM

## 2013-08-04 ENCOUNTER — Ambulatory Visit
Admission: RE | Admit: 2013-08-04 | Discharge: 2013-08-04 | Disposition: A | Discharge status: Home / Self Care | Payer: BC Managed Care – PPO | Source: Ambulatory Visit | Attending: Otolaryngology | Admitting: Otolaryngology

## 2013-08-04 ENCOUNTER — Other Ambulatory Visit (HOSPITAL_COMMUNITY)
Admission: RE | Admit: 2013-08-04 | Discharge: 2013-08-04 | Disposition: A | Discharge status: Home / Self Care | Payer: BC Managed Care – PPO | Source: Ambulatory Visit | Attending: Diagnostic Radiology | Admitting: Diagnostic Radiology

## 2013-08-04 DIAGNOSIS — E041 Nontoxic single thyroid nodule: Secondary | ICD-10-CM

## 2013-09-29 ENCOUNTER — Other Ambulatory Visit: Payer: Self-pay | Admitting: Physical Medicine and Rehabilitation

## 2013-09-29 DIAGNOSIS — M62838 Other muscle spasm: Secondary | ICD-10-CM

## 2013-09-29 DIAGNOSIS — G894 Chronic pain syndrome: Secondary | ICD-10-CM

## 2013-09-29 DIAGNOSIS — M5412 Radiculopathy, cervical region: Secondary | ICD-10-CM

## 2013-09-29 DIAGNOSIS — M542 Cervicalgia: Secondary | ICD-10-CM

## 2013-10-06 ENCOUNTER — Ambulatory Visit
Admission: RE | Admit: 2013-10-06 | Discharge: 2013-10-06 | Disposition: A | Discharge status: Home / Self Care | Payer: BC Managed Care – PPO | Source: Ambulatory Visit | Attending: Physical Medicine and Rehabilitation | Admitting: Physical Medicine and Rehabilitation

## 2013-10-06 DIAGNOSIS — M542 Cervicalgia: Secondary | ICD-10-CM

## 2013-10-06 DIAGNOSIS — M62838 Other muscle spasm: Secondary | ICD-10-CM

## 2013-10-06 DIAGNOSIS — M5412 Radiculopathy, cervical region: Secondary | ICD-10-CM

## 2013-10-06 DIAGNOSIS — G894 Chronic pain syndrome: Secondary | ICD-10-CM

## 2014-06-13 ENCOUNTER — Encounter (HOSPITAL_COMMUNITY): Payer: Self-pay | Admitting: Family Medicine

## 2014-06-13 ENCOUNTER — Emergency Department (HOSPITAL_COMMUNITY): Payer: BC Managed Care – PPO

## 2014-06-13 ENCOUNTER — Emergency Department (HOSPITAL_COMMUNITY)
Admission: EM | Admit: 2014-06-13 | Discharge: 2014-06-13 | Disposition: A | Discharge status: Home / Self Care | Payer: BC Managed Care – PPO | Attending: Emergency Medicine | Admitting: Emergency Medicine

## 2014-06-13 DIAGNOSIS — R1110 Vomiting, unspecified: Secondary | ICD-10-CM | POA: Diagnosis not present

## 2014-06-13 DIAGNOSIS — Z880 Allergy status to penicillin: Secondary | ICD-10-CM | POA: Insufficient documentation

## 2014-06-13 DIAGNOSIS — K219 Gastro-esophageal reflux disease without esophagitis: Secondary | ICD-10-CM | POA: Insufficient documentation

## 2014-06-13 DIAGNOSIS — Z8639 Personal history of other endocrine, nutritional and metabolic disease: Secondary | ICD-10-CM | POA: Insufficient documentation

## 2014-06-13 DIAGNOSIS — R1011 Right upper quadrant pain: Secondary | ICD-10-CM | POA: Diagnosis present

## 2014-06-13 DIAGNOSIS — R1013 Epigastric pain: Secondary | ICD-10-CM | POA: Diagnosis not present

## 2014-06-13 DIAGNOSIS — I10 Essential (primary) hypertension: Secondary | ICD-10-CM | POA: Diagnosis not present

## 2014-06-13 LAB — COMPREHENSIVE METABOLIC PANEL
ALBUMIN: 4.1 g/dL (ref 3.5–5.2)
ALT: 26 U/L (ref 0–35)
AST: 36 U/L (ref 0–37)
Alkaline Phosphatase: 83 U/L (ref 39–117)
Anion gap: 7 (ref 5–15)
BILIRUBIN TOTAL: 0.1 mg/dL — AB (ref 0.3–1.2)
BUN: 27 mg/dL — ABNORMAL HIGH (ref 6–23)
CO2: 27 mmol/L (ref 19–32)
Calcium: 10.1 mg/dL (ref 8.4–10.5)
Chloride: 104 mmol/L (ref 96–112)
Creatinine, Ser: 1.16 mg/dL — ABNORMAL HIGH (ref 0.50–1.10)
GFR calc Af Amer: 57 mL/min — ABNORMAL LOW (ref >90)
GFR calc non Af Amer: 49 mL/min — ABNORMAL LOW (ref >90)
Glucose, Bld: 104 mg/dL — ABNORMAL HIGH (ref 70–99)
POTASSIUM: 4.1 mmol/L (ref 3.5–5.1)
SODIUM: 138 mmol/L (ref 135–145)
Total Protein: 8 g/dL (ref 6.0–8.3)

## 2014-06-13 LAB — CBC WITH DIFFERENTIAL/PLATELET
BASOS PCT: 0 % (ref 0–1)
Basophils Absolute: 0 10*3/uL (ref 0.0–0.1)
Eosinophils Absolute: 0.1 10*3/uL (ref 0.0–0.7)
Eosinophils Relative: 1 % (ref 0–5)
HCT: 37.8 % (ref 36.0–46.0)
Hemoglobin: 12.9 g/dL (ref 12.0–15.0)
LYMPHS ABS: 1 10*3/uL (ref 0.7–4.0)
Lymphocytes Relative: 8 % — ABNORMAL LOW (ref 12–46)
MCH: 30.8 pg (ref 26.0–34.0)
MCHC: 34.1 g/dL (ref 30.0–36.0)
MCV: 90.2 fL (ref 78.0–100.0)
MONOS PCT: 7 % (ref 3–12)
Monocytes Absolute: 0.8 10*3/uL (ref 0.1–1.0)
NEUTROS ABS: 10.3 10*3/uL — AB (ref 1.7–7.7)
Neutrophils Relative %: 84 % — ABNORMAL HIGH (ref 43–77)
Platelets: 243 10*3/uL (ref 150–400)
RBC: 4.19 MIL/uL (ref 3.87–5.11)
RDW: 13.5 % (ref 11.5–15.5)
WBC: 12.1 10*3/uL — ABNORMAL HIGH (ref 4.0–10.5)

## 2014-06-13 LAB — URINALYSIS, ROUTINE W REFLEX MICROSCOPIC
Bilirubin Urine: NEGATIVE
Glucose, UA: NEGATIVE mg/dL
Hgb urine dipstick: NEGATIVE
Ketones, ur: 15 mg/dL — AB
Leukocytes, UA: NEGATIVE
Nitrite: NEGATIVE
PH: 5 (ref 5.0–8.0)
PROTEIN: NEGATIVE mg/dL
Specific Gravity, Urine: 1.023 (ref 1.005–1.030)
Urobilinogen, UA: 0.2 mg/dL (ref 0.0–1.0)

## 2014-06-13 LAB — LIPASE, BLOOD: Lipase: 41 U/L (ref 11–59)

## 2014-06-13 LAB — I-STAT CG4 LACTIC ACID, ED: LACTIC ACID, VENOUS: 1.35 mmol/L (ref 0.5–2.0)

## 2014-06-13 MED ORDER — ONDANSETRON HCL 4 MG/2ML IJ SOLN
4.0000 mg | Freq: Once | INTRAMUSCULAR | Status: AC
  Administered 2014-06-13: 4 mg via INTRAVENOUS
  Filled 2014-06-13: qty 2

## 2014-06-13 MED ORDER — ONDANSETRON 4 MG PO TBDP: 4.0000 mg | ORAL_TABLET | Freq: Three times a day (TID) | ORAL | Status: DC | PRN

## 2014-06-13 MED ORDER — ALUM & MAG HYDROXIDE-SIMETH 200-200-20 MG/5ML PO SUSP
30.0000 mL | Freq: Once | ORAL | Status: AC
  Administered 2014-06-13: 30 mL via ORAL
  Filled 2014-06-13: qty 30

## 2014-06-13 MED ORDER — SODIUM CHLORIDE 0.9 % IV BOLUS (SEPSIS)
1000.0000 mL | Freq: Once | INTRAVENOUS | Status: AC
  Administered 2014-06-13: 1000 mL via INTRAVENOUS

## 2014-06-13 NOTE — ED Notes (Signed)
Patient transported to Ultrasound 

## 2014-06-13 NOTE — Discharge Instructions (Signed)
Abdominal Pain, Women Natalie Boyd, your ultrasound did not show any problems with your liver or gallbladder.  Your symptoms are likely from reflux or heartburn.  You can consider taking zantac, protonix, or maalox for relief.  Also avoiding spicy foods can help.  Follow up with your gastroenterologist for further recommendations.  If any symptoms worsen, come back to the ED immediately.  Thank you. Abdominal (stomach, pelvic, or belly) pain can be caused by many things. It is important to tell your doctor:  The location of the pain.  Does it come and go or is it present all the time?  Are there things that start the pain (eating certain foods, exercise)?  Are there other symptoms associated with the pain (fever, nausea, vomiting, diarrhea)? All of this is helpful to know when trying to find the cause of the pain. CAUSES   Stomach: virus or bacteria infection, or ulcer.  Intestine: appendicitis (inflamed appendix), regional ileitis (Crohn's disease), ulcerative colitis (inflamed colon), irritable bowel syndrome, diverticulitis (inflamed diverticulum of the colon), or cancer of the stomach or intestine.  Gallbladder disease or stones in the gallbladder.  Kidney disease, kidney stones, or infection.  Pancreas infection or cancer.  Fibromyalgia (pain disorder).  Diseases of the female organs:  Uterus: fibroid (non-cancerous) tumors or infection.  Fallopian tubes: infection or tubal pregnancy.  Ovary: cysts or tumors.  Pelvic adhesions (scar tissue).  Endometriosis (uterus lining tissue growing in the pelvis and on the pelvic organs).  Pelvic congestion syndrome (female organs filling up with blood just before the menstrual period).  Pain with the menstrual period.  Pain with ovulation (producing an egg).  Pain with an IUD (intrauterine device, birth control) in the uterus.  Cancer of the female organs.  Functional pain (pain not caused by a disease, may improve without  treatment).  Psychological pain.  Depression. DIAGNOSIS  Your doctor will decide the seriousness of your pain by doing an examination.  Blood tests.  X-rays.  Ultrasound.  CT scan (computed tomography, special type of X-ray).  MRI (magnetic resonance imaging).  Cultures, for infection.  Barium enema (dye inserted in the large intestine, to better view it with X-rays).  Colonoscopy (looking in intestine with a lighted tube).  Laparoscopy (minor surgery, looking in abdomen with a lighted tube).  Major abdominal exploratory surgery (looking in abdomen with a large incision). TREATMENT  The treatment will depend on the cause of the pain.   Many cases can be observed and treated at home.  Over-the-counter medicines recommended by your caregiver.  Prescription medicine.  Antibiotics, for infection.  Birth control pills, for painful periods or for ovulation pain.  Hormone treatment, for endometriosis.  Nerve blocking injections.  Physical therapy.  Antidepressants.  Counseling with a psychologist or psychiatrist.  Minor or major surgery. HOME CARE INSTRUCTIONS   Do not take laxatives, unless directed by your caregiver.  Take over-the-counter pain medicine only if ordered by your caregiver. Do not take aspirin because it can cause an upset stomach or bleeding.  Try a clear liquid diet (broth or water) as ordered by your caregiver. Slowly move to a bland diet, as tolerated, if the pain is related to the stomach or intestine.  Have a thermometer and take your temperature several times a day, and record it.  Bed rest and sleep, if it helps the pain.  Avoid sexual intercourse, if it causes pain.  Avoid stressful situations.  Keep your follow-up appointments and tests, as your caregiver orders.  If the  pain does not go away with medicine or surgery, you may try:  Acupuncture.  Relaxation exercises (yoga, meditation).  Group therapy.  Counseling. SEEK  MEDICAL CARE IF:   You notice certain foods cause stomach pain.  Your home care treatment is not helping your pain.  You need stronger pain medicine.  You want your IUD removed.  You feel faint or lightheaded.  You develop nausea and vomiting.  You develop a rash.  You are having side effects or an allergy to your medicine. SEEK IMMEDIATE MEDICAL CARE IF:   Your pain does not go away or gets worse.  You have a fever.  Your pain is felt only in portions of the abdomen. The right side could possibly be appendicitis. The left lower portion of the abdomen could be colitis or diverticulitis.  You are passing blood in your stools (bright red or black tarry stools, with or without vomiting).  You have blood in your urine.  You develop chills, with or without a fever.  You pass out. MAKE SURE YOU:   Understand these instructions.  Will watch your condition.  Will get help right away if you are not doing well or get worse. Document Released: 01/27/2007 Document Revised: 08/16/2013 Document Reviewed: 02/16/2009 [facility] Patient Information 2015 [place], [place]. This information is not intended to replace advice given to you by your health care provider. Make sure you discuss any questions you have with your health care provider. Gastritis, Adult Gastritis is soreness and puffiness (inflammation) of the lining of the stomach. If you do not get help, gastritis can cause bleeding and sores (ulcers) in the stomach. HOME CARE   Only take medicine as told by your doctor.  If you were given antibiotic medicines, take them as told. Finish the medicines even if you start to feel better.  Drink enough fluids to keep your pee (urine) clear or pale yellow.  Avoid foods and drinks that make your problems worse. Foods you may want to avoid include:  Caffeine or alcohol.  Chocolate.  Mint.  Garlic and onions.  Spicy foods.  Citrus fruits, including oranges, lemons, or  limes.  Food containing tomatoes, including sauce, chili, salsa, and pizza.  Fried and fatty foods.  Eat small meals throughout the day instead of large meals. GET HELP RIGHT AWAY IF:   You have black or dark red poop (stools).  You throw up (vomit) blood. It may look like coffee grounds.  You cannot keep fluids down.  Your belly (abdominal) pain gets worse.  You have a fever.  You do not feel better after 1 week.  You have any other questions or concerns. MAKE SURE YOU:   Understand these instructions.  Will watch your condition.  Will get help right away if you are not doing well or get worse. Document Released: 09/18/2007 Document Revised: 06/24/2011 Document Reviewed: 05/15/2011 [facility] Patient Information 2015 [place], [place]. This information is not intended to replace advice given to you by your health care provider. Make sure you discuss any questions you have with your health care provider.

## 2014-06-13 NOTE — ED Provider Notes (Addendum)
CSN: [id number]     Arrival date & time 06/13/14  0418 History   First MD Initiated Contact with Patient 06/13/14 0421     Chief Complaint  Patient presents with  . Abdominal Pain  . Emesis     (Consider location/radiation/quality/duration/timing/severity/associated sxs/prior Treatment) HPI Natalie Boyd is a 64 y.o. 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 Patient states her symptoms began 2 days ago with nausea. Nothing makes the symptoms better or worse. This evening she then had right upper quadrant pain with multiple episodes of emesis. Over the last 2 days she's noticed the output from her ileostomy bag has become increasingly watery. She denies fevers or recent infections. 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 She denies any urinary symptoms. Patient has no further complaints.  10 Systems reviewed and are negative for acute change except as noted in the HPI.     Past Medical History  Diagnosis Date  . Hypertension   . Hyperlipidemia   . Colitis   . Pancreatitis   . Reflux    History reviewed. No pertinent past surgical history. History reviewed. No pertinent family history. History  Substance Use Topics  . Smoking status: Never Smoker   . Smokeless tobacco: Not on file  . Alcohol Use: No   OB History    No data available     Review of Systems    Allergies  Codeine; Doxycycline; Erythromycin; Penicillins; and Sulfa antibiotics  Home Medications   Prior to Admission medications   Medication Sig Start Date End Date Taking? Authorizing Provider  azithromycin (ZITHROMAX) 250 MG tablet  12/31/12   Historical Provider, MD  candesartan (ATACAND) 16 MG tablet  03/10/13   Historical Provider, MD  CRESTOR 10 MG tablet  03/20/13   Historical Provider, MD  Dermatological Products,  [facility]) lotion  03/10/13   Historical Provider, MD  levofloxacin (LEVAQUIN) 500 MG tablet  01/19/13   Historical Provider, MD  methocarbamol (ROBAXIN) 750 MG tablet  03/25/13   Historical Provider, MD  montelukast (SINGULAIR) 10 MG tablet  03/20/13   Historical Provider, MD  NASONEX 50 MCG/ACT nasal spray  01/19/13   Historical Provider, MD  nortriptyline (PAMELOR) 25 MG capsule  03/10/13   Historical Provider, MD  pantoprazole (PROTONIX) 40 MG tablet  03/30/13   Historical Provider, MD  predniSONE (DELTASONE) 10 MG tablet  01/19/13   Historical Provider, MD  triamcinolone ointment (KENALOG) 0.1 %  03/01/13   Historical Provider, MD   BP 103/68 mmHg  Pulse 104  Temp(Src) 97.7 F (36.5 C) (Oral)  Resp 18  Ht 5\' 7"  (1.702 m)  Wt 143 lb (64.864 kg)  BMI 22.39 kg/m2  SpO2 100% Physical Exam  Constitutional: She is oriented to person, place, and time. She appears well-developed and well-nourished. No distress.  HENT:  Head: Normocephalic and atraumatic.  Nose: Nose normal.  Mouth/Throat: Oropharynx is clear and moist. No oropharyngeal exudate.  Eyes: Conjunctivae and EOM are normal. Pupils are equal, round, and reactive to light. No scleral icterus.  Neck: Normal range of motion. Neck supple. No JVD present. No tracheal deviation present. No thyromegaly present.  Cardiovascular: Normal rate, regular rhythm and normal heart sounds.  Exam reveals no gallop and no friction rub.   No murmur heard. Pulmonary/Chest: Effort normal and breath sounds normal. No respiratory distress. She has no wheezes. She  exhibits no tenderness.  Abdominal: Soft. Bowel sounds are normal. She exhibits no distension and no mass. There is tenderness. There is no rebound and no guarding.  TTP RUQ midepigastric  Musculoskeletal: Normal range of motion. She exhibits no edema or tenderness.  Lymphadenopathy:    She has no cervical adenopathy.  Neurological: She is alert and oriented to person, place, and time. No  cranial nerve deficit. She exhibits normal muscle tone.  Skin: Skin is warm and dry. No rash noted. No erythema. No pallor.  Nursing note and vitals reviewed.   ED Course  Procedures (including critical care time) Labs Review Labs Reviewed  CBC WITH DIFFERENTIAL/PLATELET - Abnormal; Notable for the following:    WBC 12.1 (*)    Neutrophils Relative % 84 (*)    Neutro Abs 10.3 (*)    Lymphocytes Relative 8 (*)    All other components within normal limits  COMPREHENSIVE METABOLIC PANEL - Abnormal; Notable for the following:    Glucose, Bld 104 (*)    BUN 27 (*)    Creatinine, Ser 1.16 (*)    Total Bilirubin 0.1 (*)    GFR calc non Af Amer 49 (*)    GFR calc Af Amer 57 (*)    All other components within normal limits  LIPASE, BLOOD  URINALYSIS, ROUTINE W REFLEX MICROSCOPIC  I-STAT CG4 LACTIC ACID, ED    Imaging Review US Abdomen Limited  06/13/2014   CLINICAL DATA:  Right upper quadrant abdominal pain  EXAM: US ABDOMEN LIMITED - RIGHT UPPER QUADRANT  COMPARISON:  04/25/2005  FINDINGS: Gallbladder:  No gallstones or wall thickening visualized. No sonographic Murphy sign noted.  Common bile duct:  Diameter: 4 mm  Liver:  No focal lesion identified. Within normal limits in parenchymal echogenicity. There is antegrade flow in the imaged portal venous system.  IMPRESSION: Negative right upper quadrant ultrasound.   Electronically Signed   By: Marnee Spring M.D.   On: 06/13/2014 05:47     EKG Interpretation   Date/Time:  Monday June 13 2014 04:36:49 EST Ventricular Rate:  97 PR Interval:  149 QRS Duration: 76 QT Interval:  346 QTC Calculation: 439 R Axis:   82 Text Interpretation:  Sinus rhythm Biatrial enlargement Borderline right  axis deviation No significant change since last tracing Confirmed by Erroll Luna [phone]) on 06/13/2014 4:50:25 AM      MDM   Final diagnoses:  RUQ pain    She presents emergency department for abdominal pain. On exam she does  have mild right upper quadrant and midepigastric tenderness to palpation. Will obtain ultrasound to evaluate for gallbladder pathology. She was given Zofran to relieve her symptoms, she is not requesting pain medication at this time. She was also given 1 L IV fluids for tachycardia. Lab results do not show an acute cause for her pain, ultrasound is negative in the right upper quadrant. Repeat evaluation the patient reveals no abdominal tenderness. Her heart rate has come down to the 90s. Upon further questioning patient states she recently stopped taking Protonix due to the rising concerns of its complications as a proton pump inhibitor. She states that her pain is consistent with her prior gastritis. I discussed options with her including Zantac, which she states does not work, resuming Protonix, and using Maalox. Patient will also call her gastroenterologist tomorrow for further recommendations. At this time her vital signs remain within her normal limits and she is safe for discharge.    Tomasita Crumble, MD  06/13/14 0929  Tomasita Crumble, MD 06/21/14 1439

## 2014-06-13 NOTE — ED Notes (Signed)
Pt here for diarrhea, vomiting and nausea since yesterday. sts some upper abdominal pain.

## 2014-06-23 ENCOUNTER — Encounter: Payer: Self-pay | Admitting: Podiatry

## 2014-06-23 ENCOUNTER — Ambulatory Visit (INDEPENDENT_AMBULATORY_CARE_PROVIDER_SITE_OTHER): Payer: BC Managed Care – PPO | Admitting: Podiatry

## 2014-06-23 DIAGNOSIS — L84 Corns and callosities: Secondary | ICD-10-CM

## 2014-06-23 NOTE — Progress Notes (Signed)
She presents today for a chief complaint of painful calluses plantar aspect of the bilateral foot distal aspect of the second toes and pain to the callused area of the fifth digit right foot.  Objective: Vital signs are stable alert and oriented 3. Pulses are strongly palpable bilateral. Neurologic sensorium is intact. The tendon reflexes are intact bilaterally muscle strength +5 over 5 dorsiflexion plantar flexors and inverters everters on physical musculature is intact. Orthopedic evaluation does demonstrates all joints distal to the ankle full range of motion without crepitation. Cutaneous evaluation demonstrates supple well-hydrated cutis with exception of reactive hyperkeratosis distal aspects of the second toes bilaterally and subsecond metatarsophalangeal joints bilaterally. She also has a lesion to the lateral aspect of the fifth digit right foot. I see no signs of complications.  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  Plan: Debrided all reactive hyperkeratosis and will follow up with her on an as-needed basis.

## 2014-09-15 ENCOUNTER — Ambulatory Visit: Payer: BC Managed Care – PPO | Admitting: Podiatry

## 2014-09-29 ENCOUNTER — Encounter: Payer: Self-pay | Admitting: Podiatry

## 2014-09-29 ENCOUNTER — Ambulatory Visit (INDEPENDENT_AMBULATORY_CARE_PROVIDER_SITE_OTHER): Payer: BC Managed Care – PPO | Admitting: Podiatry

## 2014-09-29 VITALS — BP 115/72 | HR 88 | Resp 12

## 2014-09-29 DIAGNOSIS — L84 Corns and callosities: Secondary | ICD-10-CM | POA: Diagnosis not present

## 2014-09-29 NOTE — Progress Notes (Signed)
She presents today for a follow-up of her bilateral calluses. She states they're doing better however they grow back so fast and she would like to have them trimmed more frequently.  Objective: Vital signs are stable she is alert and oriented 3. Areas of porokeratosis to the plantar aspect of the second metatarsal bilateral as well as the tips of the second toes bilateral. She also has a poor keratoma located overlying the fifth metatarsophalangeal joint and the proximal phalanx of the fifth toe.  Assessment: Causing calluses bilateral.  Plan: Debridement of all reactive hyperkeratotic lesions of porokeratotic lesions bilateral. Follow up with her in a couple of months

## 2014-12-01 ENCOUNTER — Encounter: Payer: Self-pay | Admitting: Podiatry

## 2014-12-01 ENCOUNTER — Ambulatory Visit (INDEPENDENT_AMBULATORY_CARE_PROVIDER_SITE_OTHER): Payer: BC Managed Care – PPO | Admitting: Podiatry

## 2014-12-01 DIAGNOSIS — L84 Corns and callosities: Secondary | ICD-10-CM | POA: Diagnosis not present

## 2014-12-01 NOTE — Progress Notes (Signed)
She presents today with chief complaint of painful calluses bilateral foot. She denies any trauma and states that she's noticed no bleeding or open wounds.    Objective: vital signs are stable alert and oriented 3. Pulses are strongly palpable. Neurologic sensorium is intact percent was seen monofilament. Deep tendon reflexes are intact bilateral and muscle strength is 5 over 5 dorsiflexors plantar flexors and inverters fevers onto the musculatures intact. Orthopedic evaluation demonstrates pes planus with hammertoe deformities bilateral. Cutaneous evaluation x-rays of the well-hydrated cutis bilateral distal clavus to the second digits bilateral. Tyloma plantar aspect of the forefoot bilateral. She also has a corn or porokeratotic lesion overlying the PIPJ fifth digit right foot. None of these wounds are open but all were debrided of their thick tissues today without iatrogenic lesions.  Assessment: pain-free secondary to tyloma is an porokeratosis with hammertoe deformities.  Plan: debridement of all reactive hyperkeratoses for her today with follow-up with her 2 months.

## 2015-01-18 ENCOUNTER — Other Ambulatory Visit: Payer: Self-pay | Admitting: Internal Medicine

## 2015-01-18 DIAGNOSIS — E041 Nontoxic single thyroid nodule: Secondary | ICD-10-CM

## 2015-01-20 ENCOUNTER — Ambulatory Visit
Admission: RE | Admit: 2015-01-20 | Discharge: 2015-01-20 | Disposition: A | Discharge status: Home / Self Care | Payer: BC Managed Care – PPO | Source: Ambulatory Visit | Attending: Internal Medicine | Admitting: Internal Medicine

## 2015-01-20 DIAGNOSIS — E041 Nontoxic single thyroid nodule: Secondary | ICD-10-CM

## 2015-01-31 ENCOUNTER — Ambulatory Visit: Payer: BC Managed Care – PPO | Admitting: Podiatry

## 2015-04-05 ENCOUNTER — Other Ambulatory Visit: Payer: Self-pay | Admitting: Gastroenterology

## 2015-04-05 DIAGNOSIS — R1084 Generalized abdominal pain: Secondary | ICD-10-CM

## 2015-04-18 ENCOUNTER — Inpatient Hospital Stay: Admission: RE | Admit: 2015-04-18 | Payer: BC Managed Care – PPO | Source: Ambulatory Visit

## 2015-04-19 ENCOUNTER — Ambulatory Visit
Admission: RE | Admit: 2015-04-19 | Discharge: 2015-04-19 | Disposition: A | Discharge status: Home / Self Care | Payer: BC Managed Care – PPO | Source: Ambulatory Visit | Attending: Gastroenterology | Admitting: Gastroenterology

## 2015-04-19 ENCOUNTER — Other Ambulatory Visit: Payer: Self-pay | Admitting: Gastroenterology

## 2015-04-19 DIAGNOSIS — R1084 Generalized abdominal pain: Secondary | ICD-10-CM

## 2015-11-07 ENCOUNTER — Ambulatory Visit (INDEPENDENT_AMBULATORY_CARE_PROVIDER_SITE_OTHER): Payer: BC Managed Care – PPO | Admitting: Podiatry

## 2015-11-07 ENCOUNTER — Encounter: Payer: Self-pay | Admitting: Podiatry

## 2015-11-07 VITALS — BP 127/77 | HR 84 | Resp 16

## 2015-11-07 DIAGNOSIS — Q828 Other specified congenital malformations of skin: Secondary | ICD-10-CM | POA: Diagnosis not present

## 2015-11-07 DIAGNOSIS — L84 Corns and callosities: Secondary | ICD-10-CM | POA: Diagnosis not present

## 2015-11-08 NOTE — Progress Notes (Signed)
She presents today with chief complaint of painful thickened calluses to the plantar aspect of the bilateral foot area  Objective: Vital signs are stable alert and oriented 3. Pulses are palpable. Neurologic sensorium is intact mild hammertoe deformity second bilateral resulting in a plantar flexed second metatarsals and reactive hyperkeratosis. No other major cutaneous abnormalities no open lesions or wounds.  Assessment: Tyloma isn't porokeratosis plantar aspect of the bilateral foot and fifth digit of the right foot.  Plan: Debridement of all reactive hyperkeratosis today follow-up with her as needed.

## 2016-02-13 ENCOUNTER — Other Ambulatory Visit: Payer: Self-pay | Admitting: Internal Medicine

## 2016-02-13 DIAGNOSIS — E2839 Other primary ovarian failure: Secondary | ICD-10-CM

## 2016-03-06 ENCOUNTER — Ambulatory Visit
Admission: RE | Admit: 2016-03-06 | Discharge: 2016-03-06 | Disposition: A | Discharge status: Home / Self Care | Payer: BC Managed Care – PPO | Source: Ambulatory Visit | Attending: Internal Medicine | Admitting: Internal Medicine

## 2016-03-06 DIAGNOSIS — E2839 Other primary ovarian failure: Secondary | ICD-10-CM

## 2016-03-26 ENCOUNTER — Ambulatory Visit: Payer: BC Managed Care – PPO | Admitting: Podiatry

## 2016-03-28 ENCOUNTER — Encounter: Payer: Self-pay | Admitting: Podiatry

## 2016-03-28 ENCOUNTER — Ambulatory Visit (INDEPENDENT_AMBULATORY_CARE_PROVIDER_SITE_OTHER): Payer: BC Managed Care – PPO | Admitting: Podiatry

## 2016-03-28 DIAGNOSIS — L600 Ingrowing nail: Secondary | ICD-10-CM | POA: Diagnosis not present

## 2016-03-28 DIAGNOSIS — Q828 Other specified congenital malformations of skin: Secondary | ICD-10-CM | POA: Diagnosis not present

## 2016-03-28 MED ORDER — NEOMYCIN-POLYMYXIN-HC 3.5-10000-1 OT SOLN: OTIC | 0 refills | Status: DC

## 2016-03-28 NOTE — Patient Instructions (Signed)

## 2016-03-31 NOTE — Progress Notes (Signed)
She presents today with a chief complaint of a painful ingrown toenail to the right hallux. She states it's been bother me writing. She refers to the tibial border hallux right. She also has porokeratosis that she like for me to trim.  Objective: Vital signs are stable she is alert and oriented 3 pulses are palpable. Neurologic sensorium is intact. Deep tendon reflexes are intact. Muscle strength is normal bilateral. Cutaneous evaluation and stretched sharply rated now worse on the tibial border of the hallux right. No erythema edema cellulitis drainage or odor. Sharp radial nail margin with thickening of the skin along the border. This is consistent with ingrown toenail. Just has porokeratosis plantar aspect of the forefoot bilateral secondary to long plantar flexed second and third metatarsals.  Assessment: Limb secondary to ingrown nail hallux right. Porokeratosis bilateral.  Plan: Debridement of all reactive hyperkeratosis. Also performed a chemical matricectomy to the tibial border of the hallux right after local anesthesia was induced. She understands this and is amenable to it I also provided with her with both oral and written home-going instructions for care and soaking of the toe. I will follow up with her when she returns from her trip and she will continue so during that time. She is provided with both oral and home-going instructions for care and soaking over toe as well as a prescription for Cortisporin Otic to be applied twice daily.

## 2016-04-18 ENCOUNTER — Ambulatory Visit: Payer: BC Managed Care – PPO | Admitting: Podiatry

## 2016-04-25 ENCOUNTER — Encounter: Payer: Self-pay | Admitting: Podiatry

## 2016-04-25 ENCOUNTER — Ambulatory Visit (INDEPENDENT_AMBULATORY_CARE_PROVIDER_SITE_OTHER): Payer: Self-pay | Admitting: Podiatry

## 2016-04-25 DIAGNOSIS — L600 Ingrowing nail: Secondary | ICD-10-CM

## 2016-04-25 NOTE — Patient Instructions (Signed)

## 2016-04-25 NOTE — Progress Notes (Signed)
She presents today for follow-up of a nail procedure hallux right. She states it is still sore. She states that she's been sick and was unable to soak it. She states that it looks much better is not as painful but it is still sore.  Objective: Vital signs are stable she is alert and oriented 3. Pulses are palpable. Tibial border of the hallux right demonstrates mild postinflammatory hyperpigmentation no erythema edema cellulitis drainage or odor. Minimal pain on palpation.  Assessment: Well-healing surgical toe tibial border hallux right.  Plan: I encouraged her to continue to soak Epsom salts and warm water soak until completely resolved notify me with questions or concerns.

## 2016-05-22 ENCOUNTER — Telehealth: Payer: Self-pay | Admitting: *Deleted

## 2016-05-22 NOTE — Telephone Encounter (Signed)
Pt states she had her toenails trimmed and they still hurt, should she get her manicure. I called pt and she said it was all right she had decided not to go, and had made an appt to be seen.

## 2016-05-28 ENCOUNTER — Encounter: Payer: Self-pay | Admitting: Podiatry

## 2016-05-28 ENCOUNTER — Ambulatory Visit (INDEPENDENT_AMBULATORY_CARE_PROVIDER_SITE_OTHER): Payer: BC Managed Care – PPO | Admitting: Podiatry

## 2016-05-28 DIAGNOSIS — Q828 Other specified congenital malformations of skin: Secondary | ICD-10-CM

## 2016-05-28 DIAGNOSIS — L84 Corns and callosities: Secondary | ICD-10-CM | POA: Diagnosis not present

## 2016-05-29 NOTE — Progress Notes (Signed)
She presents today for follow-up of painful porokeratotic lesions and painful elongated nails bilaterally.  Objective: Vital signs are stable she is alert and oriented 3. Pulses are palpable. Her matricectomy sized on the healed very nicely she has considerable amount of dry skin callus skin in the area which were debrided for her today. This made this much more comfortable she also has reactive hyperkeratosis subsecond metatarsophalangeal joint bilaterally or keratoma to the fifth metatarsal area of the right foot and the second toes bilaterally. Nails are long thick yellow dystrophic.  Assessment: Pain in limb secondary to nail dystrophy onychomycosis and porokeratosis.  Plan: Debridement of all reactive hyperkeratotic tissue debridement of nails.

## 2016-07-31 ENCOUNTER — Other Ambulatory Visit: Payer: Self-pay | Admitting: Obstetrics & Gynecology

## 2016-08-02 LAB — CYTOLOGY - PAP

## 2017-03-05 ENCOUNTER — Other Ambulatory Visit: Payer: Self-pay | Admitting: Internal Medicine

## 2017-03-05 DIAGNOSIS — M5442 Lumbago with sciatica, left side: Secondary | ICD-10-CM

## 2017-03-15 ENCOUNTER — Other Ambulatory Visit: Payer: BC Managed Care – PPO

## 2017-03-24 ENCOUNTER — Other Ambulatory Visit: Payer: BC Managed Care – PPO

## 2017-03-26 ENCOUNTER — Ambulatory Visit
Admission: RE | Admit: 2017-03-26 | Discharge: 2017-03-26 | Disposition: A | Discharge status: Home / Self Care | Payer: BC Managed Care – PPO | Source: Ambulatory Visit | Attending: Internal Medicine | Admitting: Internal Medicine

## 2017-03-26 DIAGNOSIS — M5442 Lumbago with sciatica, left side: Secondary | ICD-10-CM

## 2017-10-22 ENCOUNTER — Encounter (HOSPITAL_COMMUNITY): Payer: Self-pay

## 2017-10-22 ENCOUNTER — Emergency Department (HOSPITAL_COMMUNITY)
Admission: EM | Admit: 2017-10-22 | Discharge: 2017-10-22 | Disposition: A | Discharge status: Home / Self Care | Payer: BC Managed Care – PPO | Attending: Emergency Medicine | Admitting: Emergency Medicine

## 2017-10-22 ENCOUNTER — Emergency Department (HOSPITAL_COMMUNITY): Payer: BC Managed Care – PPO

## 2017-10-22 ENCOUNTER — Other Ambulatory Visit: Payer: Self-pay

## 2017-10-22 DIAGNOSIS — Z79899 Other long term (current) drug therapy: Secondary | ICD-10-CM | POA: Insufficient documentation

## 2017-10-22 DIAGNOSIS — R51 Headache: Secondary | ICD-10-CM | POA: Insufficient documentation

## 2017-10-22 DIAGNOSIS — S161XXA Strain of muscle, fascia and tendon at neck level, initial encounter: Secondary | ICD-10-CM | POA: Diagnosis not present

## 2017-10-22 DIAGNOSIS — I10 Essential (primary) hypertension: Secondary | ICD-10-CM | POA: Diagnosis not present

## 2017-10-22 DIAGNOSIS — Y939 Activity, unspecified: Secondary | ICD-10-CM | POA: Diagnosis not present

## 2017-10-22 DIAGNOSIS — Y999 Unspecified external cause status: Secondary | ICD-10-CM | POA: Insufficient documentation

## 2017-10-22 DIAGNOSIS — S199XXA Unspecified injury of neck, initial encounter: Secondary | ICD-10-CM | POA: Diagnosis present

## 2017-10-22 DIAGNOSIS — Y929 Unspecified place or not applicable: Secondary | ICD-10-CM | POA: Diagnosis not present

## 2017-10-22 MED ORDER — NAPROXEN 500 MG PO TABS: 500.0000 mg | ORAL_TABLET | Freq: Two times a day (BID) | ORAL | 0 refills | Status: DC
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
## 2017-10-22 NOTE — Discharge Instructions (Addendum)
Take naproxen 2 times a day with meals.  Do not take other anti-inflammatories at the same time open (Advil, Motrin, ibuprofen, Aleve). You may supplement with Tylenol if you need further pain control. Use muscle creams (salonpas, icy hot, bengay) for pain control.  Use heating pads to help control your pain. You qill likely have continued muscle stiffness and soreness over the next couple days.  Follow-up with primary care next week if your symptoms are not improving. Return to the emergency room if you develop vision changes, vomiting, slurred speech, numbness, loss of bowel or bladder control, or any new or worsening symptoms.

## 2017-12-02 ENCOUNTER — Ambulatory Visit: Payer: BC Managed Care – PPO | Admitting: Neurology

## 2018-01-27 ENCOUNTER — Encounter (HOSPITAL_COMMUNITY): Payer: Self-pay | Admitting: Emergency Medicine

## 2018-01-27 ENCOUNTER — Emergency Department (HOSPITAL_COMMUNITY): Payer: BC Managed Care – PPO

## 2018-01-27 ENCOUNTER — Observation Stay (HOSPITAL_COMMUNITY)
Admission: EM | Admit: 2018-01-27 | Discharge: 2018-01-28 | Disposition: A | Discharge status: Home / Self Care | Payer: BC Managed Care – PPO | Attending: Internal Medicine | Admitting: Internal Medicine

## 2018-01-27 DIAGNOSIS — M069 Rheumatoid arthritis, unspecified: Secondary | ICD-10-CM | POA: Diagnosis not present

## 2018-01-27 DIAGNOSIS — I10 Essential (primary) hypertension: Secondary | ICD-10-CM

## 2018-01-27 DIAGNOSIS — R0789 Other chest pain: Principal | ICD-10-CM | POA: Insufficient documentation

## 2018-01-27 DIAGNOSIS — R079 Chest pain, unspecified: Secondary | ICD-10-CM | POA: Diagnosis present

## 2018-01-27 DIAGNOSIS — Z9114 Patient's other noncompliance with medication regimen: Secondary | ICD-10-CM | POA: Insufficient documentation

## 2018-01-27 DIAGNOSIS — Z79899 Other long term (current) drug therapy: Secondary | ICD-10-CM | POA: Insufficient documentation

## 2018-01-27 DIAGNOSIS — G500 Trigeminal neuralgia: Secondary | ICD-10-CM | POA: Diagnosis not present

## 2018-01-27 DIAGNOSIS — E785 Hyperlipidemia, unspecified: Secondary | ICD-10-CM | POA: Diagnosis not present

## 2018-01-27 LAB — BASIC METABOLIC PANEL
ANION GAP: 7 (ref 5–15)
BUN: 13 mg/dL (ref 8–23)
CALCIUM: 9.1 mg/dL (ref 8.9–10.3)
CHLORIDE: 107 mmol/L (ref 98–111)
CO2: 23 mmol/L (ref 22–32)
CREATININE: 1.06 mg/dL — AB (ref 0.44–1.00)
GFR calc Af Amer: >60 mL/min (ref >60)
GFR calc non Af Amer: 53 mL/min — ABNORMAL LOW (ref >60)
GLUCOSE: 88 mg/dL (ref 70–99)
POTASSIUM: 3.7 mmol/L (ref 3.5–5.1)
SODIUM: 137 mmol/L (ref 135–145)

## 2018-01-27 LAB — CBC
HCT: 36 % (ref 36.0–46.0)
Hemoglobin: 11.9 g/dL — ABNORMAL LOW (ref 12.0–15.0)
MCH: 31.2 pg (ref 26.0–34.0)
MCHC: 33.1 g/dL (ref 30.0–36.0)
MCV: 94.2 fL (ref 80.0–100.0)
Platelets: 213 10*3/uL (ref 150–400)
RBC: 3.82 MIL/uL — ABNORMAL LOW (ref 3.87–5.11)
RDW: 13.5 % (ref 11.5–15.5)
WBC: 3.7 10*3/uL — ABNORMAL LOW (ref 4.0–10.5)
nRBC: 0 % (ref 0.0–0.2)

## 2018-01-27 LAB — URINALYSIS, ROUTINE W REFLEX MICROSCOPIC
Bilirubin Urine: NEGATIVE
Glucose, UA: NEGATIVE mg/dL
HGB URINE DIPSTICK: NEGATIVE
Ketones, ur: NEGATIVE mg/dL
LEUKOCYTES UA: NEGATIVE
Nitrite: NEGATIVE
PROTEIN: NEGATIVE mg/dL
SPECIFIC GRAVITY, URINE: 1.021 (ref 1.005–1.030)
pH: 5 (ref 5.0–8.0)

## 2018-01-27 LAB — TROPONIN I: Troponin I: <0.03 ng/mL (ref <0.03)

## 2018-01-27 LAB — I-STAT TROPONIN, ED: Troponin i, poc: 0.01 ng/mL (ref 0.00–0.08)

## 2018-01-27 MED ORDER — SODIUM CHLORIDE 0.9% FLUSH
3.0000 mL | Freq: Two times a day (BID) | INTRAVENOUS | Status: DC
  Administered 2018-01-27: 3 mL via INTRAVENOUS

## 2018-01-27 MED ORDER — SODIUM CHLORIDE 0.9 % IV SOLN: 250.0000 mL | INTRAVENOUS | Status: DC | PRN

## 2018-01-27 MED ORDER — HYDROXYCHLOROQUINE SULFATE 200 MG PO TABS
200.0000 mg | ORAL_TABLET | Freq: Two times a day (BID) | ORAL | Status: DC
  Administered 2018-01-27 – 2018-01-28 (×2): 200 mg via ORAL
  Filled 2018-01-27 (×4): qty 1

## 2018-01-27 MED ORDER — CARBAMAZEPINE ER 200 MG PO TB12
200.0000 mg | ORAL_TABLET | Freq: Two times a day (BID) | ORAL | Status: DC
  Administered 2018-01-27 – 2018-01-28 (×2): 200 mg via ORAL
  Filled 2018-01-27 (×4): qty 1

## 2018-01-27 MED ORDER — EPINEPHRINE 0.3 MG/0.3ML IJ SOAJ: 0.3000 mg | Freq: Once | INTRAMUSCULAR | Status: DC

## 2018-01-27 MED ORDER — SODIUM CHLORIDE 0.9% FLUSH: 3.0000 mL | INTRAVENOUS | Status: DC | PRN

## 2018-01-27 MED ORDER — ENOXAPARIN SODIUM 40 MG/0.4ML ~~LOC~~ SOLN
40.0000 mg | SUBCUTANEOUS | Status: DC
  Administered 2018-01-27: 40 mg via SUBCUTANEOUS
  Filled 2018-01-27: qty 0.4

## 2018-01-27 MED ORDER — ONDANSETRON HCL 4 MG PO TABS: 4.0000 mg | ORAL_TABLET | Freq: Four times a day (QID) | ORAL | Status: DC | PRN

## 2018-01-27 MED ORDER — ASPIRIN 81 MG PO CHEW
324.0000 mg | CHEWABLE_TABLET | Freq: Once | ORAL | Status: AC
  Administered 2018-01-27: 324 mg via ORAL
  Filled 2018-01-27: qty 4

## 2018-01-27 MED ORDER — IRBESARTAN 150 MG PO TABS
75.0000 mg | ORAL_TABLET | Freq: Every day | ORAL | Status: DC
  Administered 2018-01-27 – 2018-01-28 (×2): 75 mg via ORAL
  Filled 2018-01-27 (×2): qty 1

## 2018-01-27 MED ORDER — ONDANSETRON HCL 4 MG/2ML IJ SOLN: 4.0000 mg | Freq: Four times a day (QID) | INTRAMUSCULAR | Status: DC | PRN

## 2018-01-27 NOTE — ED Notes (Signed)
Patient ambulatory to bathroom with steady gait at this time to provide urine sample 

## 2018-01-27 NOTE — ED Triage Notes (Signed)
Patient to ED c/o non-radiating L sided CP x 2 days, intermittent, worse this morning. Patient denies cardiac history or having chest pain before. She reports tingling sensation to L arm as well. Denies dizziness, shortness of breath, N/V. Skin warm/dry, resp e/u.

## 2018-01-27 NOTE — ED Provider Notes (Signed)
[facility] EMERGENCY DEPARTMENT Provider Note   CSN: [id number] Arrival date & time: 01/27/18  5093     History   Chief Complaint Chief Complaint  Patient presents with  . Chest Pain    HPI Natalie Boyd is a 67 y.o. female.  The history is provided by the patient.  Chest Pain   This is a new problem. The current episode started 2 days ago. The problem occurs every several days. The problem has been resolved. The pain is associated with rest. The pain is present in the substernal region. The pain is at a severity of 3/10. The pain is mild. The quality of the pain is described as pressure-like. The pain does not radiate. Associated symptoms include nausea and weakness. Pertinent negatives include no abdominal pain, no back pain, no cough, no diaphoresis, no dizziness, no exertional chest pressure, no fever, no headaches, no hemoptysis, no irregular heartbeat, no leg pain, no malaise/fatigue, no near-syncope, no numbness, no orthopnea, no palpitations, no shortness of breath, no syncope and no vomiting.  Her past medical history is significant for hyperlipidemia and hypertension.  Pertinent negatives for past medical history include no CAD, no PE, no seizures and no strokes.  Her family medical history is significant for CAD.    Past Medical History:  Diagnosis Date  . Colitis   . Hyperlipidemia   . Hypertension   . Pancreatitis   . Reflux     There are no active problems to display for this patient.   History reviewed. No pertinent surgical history.   OB History   None      Home Medications    Prior to Admission medications   Medication Sig Start Date End Date Taking? Authorizing Provider  B Complex-Biotin-FA (B COMPLETE PO) Take 1 tablet by mouth daily at 12 noon.   Yes [provider]  carbamazepine (CARBATROL) 200 MG 12 hr capsule Take 200 mg by mouth 2 (two) times daily. 01/05/18  Yes [provider]  Cholecalciferol  (VITAMIN D3) 1000 units CAPS Take 1,000 mg by mouth daily at 12 noon.   Yes [provider]  Coenzyme Q10 (CO Q-10) 200 MG CAPS Take 200 mg by mouth daily at 12 noon.   Yes [provider]  EPINEPHrine (EPIPEN 2-PAK) 0.3 mg/0.3 mL IJ SOAJ injection Inject 0.3 mLs as directed once.   Yes [provider]  hydroxychloroquine (PLAQUENIL) 200 MG tablet Take 200 mg by mouth 2 (two) times daily. 01/19/18  Yes [provider]  Omega-3 1000 MG CAPS Take 1,000 mg by mouth daily at 12 noon.   Yes [provider]  candesartan (ATACAND) 16 MG tablet Take 16 mg by mouth daily.    [provider]  Dermatological Products, Misc. [facility]) lotion  03/10/13   [provider]  naproxen (NAPROSYN) 500 MG tablet Take 1 tablet (500 mg total) by mouth 2 (two) times daily with a meal. Patient not taking: Reported on 01/27/2018 10/22/17   Caccavale, Sophia, PA-C  neomycin-polymyxin-hydrocortisone (CORTISPORIN) otic solution Apply one to two drops to toe after soaking twice daily. Patient not taking: Reported on 01/27/2018 03/28/16   Elinor Parkinson, DPM    Family History No family history on file.  Social History Social History   Tobacco Use  . Smoking status: Never Smoker  Substance Use Topics  . Alcohol use: No  . Drug use: No     Allergies   Penicillins; Codeine; Doxycycline; Erythromycin; Other; Soy allergy;  and Sulfa antibiotics   Review of Systems Review of Systems  Constitutional: Negative for chills, diaphoresis, fever and malaise/fatigue.  HENT: Negative for ear pain and sore throat.   Eyes: Negative for pain and visual disturbance.  Respiratory: Negative for cough, hemoptysis and shortness of breath.   Cardiovascular: Positive for chest pain. Negative for palpitations, orthopnea, syncope and near-syncope.  Gastrointestinal: Positive for nausea. Negative for abdominal pain and vomiting.  Genitourinary: Negative for dysuria and  hematuria.  Musculoskeletal: Negative for arthralgias and back pain.  Skin: Negative for color change and rash.  Neurological: Positive for weakness. Negative for dizziness, seizures, syncope, numbness and headaches.  All other systems reviewed and are negative.    Physical Exam Updated Vital Signs  ED Triage Vitals  Enc Vitals Group     BP 01/27/18 0944 138/84     Pulse Rate 01/27/18 0944 80     Resp 01/27/18 0944 16     Temp 01/27/18 0944 98.4 F (36.9 C)     Temp Source 01/27/18 0944 Oral     SpO2 01/27/18 0944 100 %     Weight --      Height --      Head Circumference --      Peak Flow --      Pain Score 01/27/18 0946 5     Pain Loc --      Pain Edu? --      Excl. in GC? --     Physical Exam  Constitutional: She is oriented to person, place, and time. She appears well-developed and well-nourished. No distress.  HENT:  Head: Normocephalic and atraumatic.  Eyes: Pupils are equal, round, and reactive to light. Conjunctivae and EOM are normal.  Neck: Normal range of motion. Neck supple.  Cardiovascular: Normal rate, regular rhythm, intact distal pulses and normal pulses.  No murmur heard. Pulmonary/Chest: Effort normal and breath sounds normal. No respiratory distress. She has no decreased breath sounds. She has no wheezes. She has no rales.  Abdominal: Soft. There is no tenderness.  Musculoskeletal: She exhibits no edema.       Right lower leg: She exhibits no edema.       Left lower leg: She exhibits no edema.  Neurological: She is alert and oriented to person, place, and time.  Skin: Skin is warm and dry. Capillary refill takes less than 2 seconds.  Psychiatric: She has a normal mood and affect.  Nursing note and vitals reviewed.    ED Treatments / Results  Labs (all labs ordered are listed, but only abnormal results are displayed) Labs Reviewed  URINALYSIS, ROUTINE W REFLEX MICROSCOPIC - Abnormal; Notable for the following components:      Result Value    APPearance CLOUDY (*)    All other components within normal limits  BASIC METABOLIC PANEL - Abnormal; Notable for the following components:   Creatinine, Ser 1.06 (*)    GFR calc non Af Amer 53 (*)    All other components within normal limits  CBC - Abnormal; Notable for the following components:   WBC 3.7 (*)    RBC 3.82 (*)    Hemoglobin 11.9 (*)    All other components within normal limits  TROPONIN I  I-STAT TROPONIN, ED    EKG EKG Interpretation  Date/Time:  Tuesday January 27 2018 09:40:29 EDT Ventricular Rate:  63 PR Interval:  156 QRS Duration: 66 QT Interval:  412 QTC Calculation: 421 R Axis:   85 Text Interpretation:  Normal sinus rhythm Right atrial enlargement Borderline ECG Confirmed by Virgina Norfolk [phone]) on 01/27/2018 11:58:52 AM   Radiology Dg Chest 2 View  Result Date: 01/27/2018 CLINICAL DATA:  Two day history of intermittent nonradiating left-sided chest pain with increased severity this morning. No previous episodes. Patient also reports tingling sensation in the left arm. EXAM: CHEST - 2 VIEW COMPARISON:  Chest x-ray of June 17, 2010 obtained as part of an abdominal series. FINDINGS: The lungs remain hyperinflated. There is no focal infiltrate. There is no pleural effusion or pneumothorax. The heart and pulmonary vascularity are normal. The mediastinum is normal in width. There is calcification in the wall of the aortic arch. There is gentle dextrocurvature centered at approximately T9. IMPRESSION: Chronic bronchitic changes.  No alveolar pneumonia nor CHF. Thoracic aortic atherosclerosis. Electronically Signed   By: David  [place] M.D.   On: 01/27/2018 10:14    Procedures Procedures (including critical care time)  Medications Ordered in ED Medications  aspirin chewable tablet 324 mg (324 mg Oral Given 01/27/18 1354)     Initial Impression / Assessment and Plan / ED Course  I have reviewed the triage vital signs and the nursing notes.  Pertinent  labs & imaging results that were available during my care of the patient were reviewed by me and considered in my medical decision making (see chart for details).     Natalie Boyd is a 67 year old female history of hypertension, high cholesterol who presents to the ED with chest pain.  Patient with normal vitals.  No fever.  Patient with episode of chest pain this morning that has now self resolved that was associated with some nausea and weakness.  She states that she had a similar episode 2 days ago.  Does not typically have chest pain.  No history of CAD.  Had a stress test years ago in [place] that was unremarkable.  Has family history of heart disease.  Patient has no DVT or PE risk factors.  Wells criteria 0 and doubt PE.  Patient denies any infectious symptoms.  Denies any reflux symptoms.  Denies any muscular wall tenderness.  Patient with overall unremarkable exam.  Clear breath sounds.  No signs of volume overload on exam.  EKG shows no signs of ischemic changes.  Patient with initial troponin within normal limits.  No significant anemia, electrolyte abnormality, kidney injury.  Patient was given aspirin.  Patient with multiple cardiac risk factors.  Has a high heart score and recommend admission for further ACS rule out with serial troponins and likely stress test.  Patient admitted to medicine service for further care.  Hemodynamically stable throughout my care.  This chart was dictated using voice recognition software.  Despite best efforts to proofread,  errors can occur which can change the documentation meaning.   Final Clinical Impressions(s) / ED Diagnoses   Final diagnoses:  Chest pain, unspecified type    ED Discharge Orders    None       Virgina Norfolk, DO 01/27/18 1634

## 2018-01-27 NOTE — H&P (Signed)
[facility]                                                                                    Patient Demographics  Natalie Boyd, is a 67 y.o. female  CSN: [id number]  MRN: [id number]  DOB - [birth year]  Admit Date - 01/27/2018  Outpatient Primary MD for the patient is Andi Devon, MD   With History of -  Past Medical History:  Diagnosis Date  . Colitis   . Hyperlipidemia   . Hypertension   . Pancreatitis   . Reflux       History reviewed. No pertinent surgical history.  in for   Chief Complaint  Patient presents with  . Chest Pain     HPI  Natalie Boyd  is a 67 y.o. female, past medical history significant for hypertension and hyperlipidemia with history of noncompliance with her medications presenting with chest pain episode that started today earlier lasted for around 1 to 2 hours pressure/stabbing associated with nausea but no cold sweats or shortness of breath.  Patient had a similar episode on Sunday.  Pain was relieved by rest.  Patient had a stress test done around 7 years ago in [place] which was negative.  She reports that she forgets to take her blood pressure medications at times.  She also has history of rheumatoid arthritis on Plaquenil.  Patient denies any history of orthopnea, PND's or history of leg swelling.  In the emergency room her EKG showed  normal sinus rhythm with no acute changes.  First troponin was negative .  Patient is admitted for chest pain rule out MI and probable stress test in a.m. patient complaining of increased forgetfulness lately.    Review of Systems    In addition to the HPI above,  No Fever-chills, No Headache, No changes with Vision or hearing, No problems swallowing food or Liquids, No  Cough or Shortness of Breath, No Abdominal pain, No Nausea or Vommitting, Bowel movements are regular, No Blood in stool or Urine, No dysuria, No new skin rashes or bruises, No new joints pains-aches,  No new  weakness, tingling, numbness in any extremity, No recent weight gain or loss, No polyuria, polydypsia or polyphagia, No significant Mental Stressors.  A full 10 point Review of Systems was done, except as stated above, all other Review of Systems were negative.   Social History Social History   Tobacco Use  . Smoking status: Never Smoker  Substance Use Topics  . Alcohol use: No     Family History No family history on file.   Prior to Admission medications   Medication Sig Start Date End Date Taking? Authorizing Provider  B Complex-Biotin-FA (B COMPLETE PO) Take 1 tablet by mouth daily at 12 noon.   Yes [provider]  carbamazepine (CARBATROL) 200 MG 12 hr capsule Take 200 mg by mouth 2 (two) times daily. 01/05/18  Yes [provider]  Cholecalciferol (VITAMIN D3) 1000 units CAPS Take 1,000 mg by mouth daily at 12 noon.   Yes [provider]  Coenzyme Q10 (CO Q-10) 200 MG CAPS Take 200 mg by mouth daily at 12 noon.  Yes [provider]  EPINEPHrine (EPIPEN 2-PAK) 0.3 mg/0.3 mL IJ SOAJ injection Inject 0.3 mLs as directed once.   Yes [provider]  hydroxychloroquine (PLAQUENIL) 200 MG tablet Take 200 mg by mouth 2 (two) times daily. 01/19/18  Yes [provider]  Omega-3 1000 MG CAPS Take 1,000 mg by mouth daily at 12 noon.   Yes [provider]  candesartan (ATACAND) 16 MG tablet Take 16 mg by mouth daily.    [provider]  [facility]at[facility], [facility]) lotion  03/10/13   [provider]  naproxen (NAPROSYN) 500 MG tablet Take 1 tablet (500 mg total) by mouth 2 (two) times daily with a meal. Patient not taking: Reported on 01/27/2018 10/22/17   Caccavale, Sophia, PA-C  neomycin-polymyxin-hydrocortisone (CORTISPORIN) otic solution Apply one to two drops to toe after soaking twice daily. Patient not taking: Reported on 01/27/2018 03/28/16   Elinor Parkinson, DPM    Allergies  Allergen  Reactions  . Penicillins     Other reaction(s): Other (See Comments) No reaction listed.   . Codeine   . Doxycycline   . Erythromycin   . Other     Other reaction(s): Other (See Comments) Allergic to all "mycins"- No reaction listed.   . Soy Allergy Diarrhea  . Sulfa Antibiotics     Physical Exam  Vitals  Blood pressure 119/68, pulse 66, temperature 98.4 F (36.9 C), temperature source Oral, resp. rate 11, SpO2 100 %.   1. General very pleasant in no acute distress  2. Normal affect and insight, Not Suicidal or Homicidal, Awake Alert, Oriented X 3.  3. No F.N deficits, grossly, patient moving all extremities.  4. Ears and Eyes appear Normal, Conjunctivae clear, PERRLA. Moist Oral Mucosa.  5. Supple Neck, No JVD, No cervical lymphadenopathy appriciated, No Carotid Bruits.  6. Symmetrical Chest wall movement, Good air movement bilaterally, CTAB.  7. RRR, No Gallops, Rubs or Murmurs, No Parasternal Heave.  8. Positive Bowel Sounds, Abdomen Soft, Non tender, No organomegaly appriciated,No rebound -guarding or rigidity.  9.  No Cyanosis, Normal Skin Turgor, No Skin Rash or Bruise.  10. Good muscle tone,  joints appear normal , no edema, Normal ROM.    Data Review  CBC Recent Labs  Lab 01/27/18 1437  WBC 3.7*  HGB 11.9*  HCT 36.0  PLT 213  MCV 94.2  MCH 31.2  MCHC 33.1  RDW 13.5   ------------------------------------------------------------------------------------------------------------------  Chemistries  Recent Labs  Lab 01/27/18 1500  NA 137  K 3.7  CL 107  CO2 23  GLUCOSE 88  BUN 13  CREATININE 1.06*  CALCIUM 9.1   ------------------------------------------------------------------------------------------------------------------ CrCl cannot be calculated (Unknown ideal weight.). ------------------------------------------------------------------------------------------------------------------ No results for input(s): TSH, T4TOTAL, T3FREE,  THYROIDAB in the last 72 hours.  Invalid input(s): FREET3   Coagulation profile No results for input(s): INR, PROTIME in the last 168 hours. ------------------------------------------------------------------------------------------------------------------- No results for input(s): DDIMER in the last 72 hours. -------------------------------------------------------------------------------------------------------------------  Cardiac Enzymes No results for input(s): CKMB, TROPONINI, MYOGLOBIN in the last 168 hours.  Invalid input(s): CK ------------------------------------------------------------------------------------------------------------------ Invalid input(s): POCBNP   ---------------------------------------------------------------------------------------------------------------  Urinalysis    Component Value Date/Time   COLORURINE YELLOW 01/27/2018 1408   APPEARANCEUR CLOUDY (A) 01/27/2018 1408   LABSPEC 1.021 01/27/2018 1408   PHURINE 5.0 01/27/2018 1408   GLUCOSEU NEGATIVE 01/27/2018 1408   HGBUR NEGATIVE 01/27/2018 1408   BILIRUBINUR NEGATIVE 01/27/2018 1408   KETONESUR NEGATIVE 01/27/2018 1408   PROTEINUR NEGATIVE 01/27/2018 1408   UROBILINOGEN  0.2 06/13/2014 0630   NITRITE NEGATIVE 01/27/2018 1408   LEUKOCYTESUR NEGATIVE 01/27/2018 1408    ----------------------------------------------------------------------------------------------------------------  Imaging results:   Dg Chest 2 View  Result Date: 01/27/2018 CLINICAL DATA:  Two day history of intermittent nonradiating left-sided chest pain with increased severity this morning. No previous episodes. Patient also reports tingling sensation in the left arm. EXAM: CHEST - 2 VIEW COMPARISON:  Chest x-ray of June 17, 2010 obtained as part of an abdominal series. FINDINGS: The lungs remain hyperinflated. There is no focal infiltrate. There is no pleural effusion or pneumothorax. The heart and pulmonary vascularity  are normal. The mediastinum is normal in width. There is calcification in the wall of the aortic arch. There is gentle dextrocurvature centered at approximately T9. IMPRESSION: Chronic bronchitic changes.  No alveolar pneumonia nor CHF. Thoracic aortic atherosclerosis. Electronically Signed   By: David  [place] M.D.   On: 01/27/2018 10:14    My personal review of EKG: Rhythm NSR, 63 B/min    Assessment & Plan  Chest pain    Serial troponins     Check EKG in a.m.     Please consult cardiology in a.m. for stress test if troponins are negative  Hypertension on Atacand    Patient not compliant with medications  Trigeminal neuralgia    Continue with Tegretol  History of rheumatoid arthritis    Continue with Plaquenil  Mild confusion at times    Check TSH and vitamin B12 levels  DVT Prophylaxis lovenox  AM Labs Ordered, also please review Full Orders    Code Status full  Disposition Plan: home  Time spent in minutes : 42 min  Condition full   @SIGNATURE @

## 2018-01-27 NOTE — ED Triage Notes (Signed)
Phlebotomy unable to get blood work in triage, patient did not want to be stuck any more and only wanted it done from her forearm.

## 2018-01-28 ENCOUNTER — Encounter (HOSPITAL_COMMUNITY): Payer: Self-pay | Admitting: Student

## 2018-01-28 ENCOUNTER — Other Ambulatory Visit: Payer: Self-pay

## 2018-01-28 ENCOUNTER — Observation Stay (HOSPITAL_BASED_OUTPATIENT_CLINIC_OR_DEPARTMENT_OTHER): Payer: BC Managed Care – PPO

## 2018-01-28 DIAGNOSIS — E785 Hyperlipidemia, unspecified: Secondary | ICD-10-CM | POA: Diagnosis not present

## 2018-01-28 DIAGNOSIS — R079 Chest pain, unspecified: Secondary | ICD-10-CM

## 2018-01-28 DIAGNOSIS — I10 Essential (primary) hypertension: Secondary | ICD-10-CM | POA: Diagnosis not present

## 2018-01-28 DIAGNOSIS — R0789 Other chest pain: Secondary | ICD-10-CM | POA: Diagnosis not present

## 2018-01-28 DIAGNOSIS — M069 Rheumatoid arthritis, unspecified: Secondary | ICD-10-CM | POA: Diagnosis not present

## 2018-01-28 DIAGNOSIS — I159 Secondary hypertension, unspecified: Secondary | ICD-10-CM

## 2018-01-28 LAB — HEMOGLOBIN A1C
Hgb A1c MFr Bld: 5.4 % (ref 4.8–5.6)
Mean Plasma Glucose: 108.28 mg/dL

## 2018-01-28 LAB — NM MYOCAR MULTI W/SPECT W/WALL MOTION / EF
CHL CUP MPHR: 153 {beats}/min
CSEPEW: 1 METS
Exercise duration (min): 5 min
Exercise duration (sec): 0 s
Peak HR: 125 {beats}/min
Percent HR: 81 %
Rest HR: 60 {beats}/min

## 2018-01-28 LAB — LIPID PANEL
CHOL/HDL RATIO: 2.9 ratio
CHOLESTEROL: 269 mg/dL — AB (ref 0–200)
HDL: 94 mg/dL (ref >40)
LDL CALC: 163 mg/dL — AB (ref 0–99)
Triglycerides: 60 mg/dL (ref <150)
VLDL: 12 mg/dL (ref 0–40)

## 2018-01-28 LAB — TSH
TSH: 1.412 u[IU]/mL (ref 0.350–4.500)
TSH: 1.45 u[IU]/mL (ref 0.350–4.500)

## 2018-01-28 LAB — TROPONIN I

## 2018-01-28 LAB — HIV ANTIBODY (ROUTINE TESTING W REFLEX): HIV SCREEN 4TH GENERATION: NONREACTIVE

## 2018-01-28 LAB — VITAMIN B12: Vitamin B-12: 929 pg/mL — ABNORMAL HIGH (ref 180–914)

## 2018-01-28 MED ORDER — REGADENOSON 0.4 MG/5ML IV SOLN
INTRAVENOUS | Status: AC
  Filled 2018-01-28: qty 5

## 2018-01-28 MED ORDER — REGADENOSON 0.4 MG/5ML IV SOLN
0.4000 mg | Freq: Once | INTRAVENOUS | Status: AC
  Administered 2018-01-28: 0.4 mg via INTRAVENOUS

## 2018-01-28 MED ORDER — CANDESARTAN CILEXETIL 16 MG PO TABS: 16.0000 mg | ORAL_TABLET | Freq: Every day | ORAL | 0 refills | Status: AC

## 2018-01-28 MED ORDER — TECHNETIUM TC 99M TETROFOSMIN IV KIT
30.0000 | PACK | Freq: Once | INTRAVENOUS | Status: AC | PRN
  Administered 2018-01-28: 30 via INTRAVENOUS

## 2018-01-28 MED ORDER — TECHNETIUM TC 99M TETROFOSMIN IV KIT
10.0000 | PACK | Freq: Once | INTRAVENOUS | Status: AC | PRN
  Administered 2018-01-28: 10 via INTRAVENOUS

## 2018-01-28 MED ORDER — ATORVASTATIN CALCIUM 40 MG PO TABS: 40.0000 mg | ORAL_TABLET | Freq: Every day | ORAL | 0 refills | Status: DC

## 2018-01-28 NOTE — Progress Notes (Signed)
Pt remains at stress testing lab

## 2018-01-28 NOTE — Discharge Summary (Signed)
Physician Discharge Summary  AJHA PERRIGO OLM:[id number] DOB: 08/30/1950 DOA: 01/27/2018  PCP: Andi Devon, MD  Admit date: 01/27/2018 Discharge date: 01/28/2018  Admitted From: Home  Disposition:  Home  Recommendations for Outpatient Follow-up:  1. Follow up with PCP in 1-2 weeks 2. Please obtain BMP/CBC in one week your next doctors visit.  3. Follow up with outpatient cardiology in about 3-4 weeks 4. Started on atorvastatin 40 mg daily  Home Health: Home Equipment/Devices: None Discharge Condition: Stable CODE STATUS: Full code Diet recommendation: 2 g salt diet  Brief/Interim Summary: 67 year old with history of essential hypertension came to the hospital with complains of atypical chest pain.  Patient states she was in her usual state of health until couple of days ago when she started developing episodic substernal pressure-like pain radiating to her left shoulder and her arm.  She stated this was nonexertional in nature and happen as she was getting ready before getting in the car for the charge.  Soon after it improved but it returned again yesterday therefore came to the ER.  In the ER her vital signs are stable and her EKG was in normal sinus rhythm. TSH was within normal limits, hemoglobin A1c was 5.4 but her LDL was elevated greater than 160.  She was started on atorvastatin 40 mg daily.  Advised diet and exercise.  Cardiology was consulted who recommended nuclear stress test and the results are as stated.  NM Stress Test:  There was no ST segment deviation noted during stress.  Very poor images due to significant extracardiac uptake. Overall grossly normal perfusion.  The left ventricular ejection fraction is hyperdynamic (>65%).  Nuclear stress EF: 86%.  This is a low risk study.  Discharge Diagnoses:  Active Problems:   Chest pain   HTN (hypertension)   HLD (hyperlipidemia)  Atypical chest pain -EKG does not show any acute ST-T changes, cardiac  enzymes are x3- -TSH and hemoglobin A1c are unremarkable.  Hemoglobin A1c is 5.4.  LDL is greater than 160 therefore started on atorvastatin.  Currently she is chest pain-free. NM Stress Test:  There was no ST segment deviation noted during stress.  Very poor images due to significant extracardiac uptake. Overall grossly normal perfusion.  The left ventricular ejection fraction is hyperdynamic (>65%).  Nuclear stress EF: 86%.  This is a low risk study.  Essential hypertension -Resume her home medications at this time.  Hyperlipidemia - LDL elevated 160, counseled on diet weight loss and exercise.  Start atorvastatin 40 mg daily  History of rheumatoid arthritis -Continue Plaquenil  Patient on Lovenox for DVT prophylaxis Full code Discharge in stable condition  Discharge Instructions   Allergies as of 01/28/2018      Reactions   Penicillins    Other reaction(s): Other (See Comments) No reaction listed.    Codeine    Doxycycline    Erythromycin    Other    Other reaction(s): Other (See Comments) Allergic to all "mycins"- No reaction listed.    Soy Allergy Diarrhea   Sulfa Antibiotics       Medication List    TAKE these medications   atorvastatin 40 MG tablet Commonly known as:  LIPITOR Take 1 tablet (40 mg total) by mouth daily.   B COMPLETE PO Take 1 tablet by mouth daily at 12 noon.   candesartan 16 MG tablet Commonly known as:  ATACAND Take 1 tablet (16 mg total) by mouth daily.   carbamazepine 200 MG 12 hr capsule Commonly known  as:  CARBATROL Take 200 mg by mouth 2 (two) times daily.   Co Q-10 200 MG Caps Take 200 mg by mouth daily at 12 noon.   EPICERAM lotion   EPIPEN 2-PAK 0.3 mg/0.3 mL Soaj injection Generic drug:  EPINEPHrine Inject 0.3 mLs as directed once.   hydroxychloroquine 200 MG tablet Commonly known as:  PLAQUENIL Take 200 mg by mouth 2 (two) times daily.   naproxen 500 MG tablet Commonly known as:  NAPROSYN Take 1 tablet  (500 mg total) by mouth 2 (two) times daily with a meal.   neomycin-polymyxin-hydrocortisone OTIC solution Commonly known as:  CORTISPORIN Apply one to two drops to toe after soaking twice daily.   Omega-3 1000 MG Caps Take 1,000 mg by mouth daily at 12 noon.   Vitamin D3 1000 units Caps Take 1,000 mg by mouth daily at 12 noon.       Allergies  Allergen Reactions  . Penicillins     Other reaction(s): Other (See Comments) No reaction listed.   . Codeine   . Doxycycline   . Erythromycin   . Other     Other reaction(s): Other (See Comments) Allergic to all "mycins"- No reaction listed.   . Soy Allergy Diarrhea  . Sulfa Antibiotics     You were cared for by a hospitalist during your hospital stay. If you have any questions about your discharge medications or the care you received while you were in the hospital after you are discharged, you can call the unit and asked to speak with the hospitalist on call if the hospitalist that took care of you is not available. Once you are discharged, your primary care physician will handle any further medical issues. Please note that no refills for any discharge medications will be authorized once you are discharged, as it is imperative that you return to your primary care physician (or establish a relationship with a primary care physician if you do not have one) for your aftercare needs so that they can reassess your need for medications and monitor your lab values.  Consultations:  Cardiology   Procedures/Studies: Dg Chest 2 View  Result Date: 01/27/2018 CLINICAL DATA:  Two day history of intermittent nonradiating left-sided chest pain with increased severity this morning. No previous episodes. Patient also reports tingling sensation in the left arm. EXAM: CHEST - 2 VIEW COMPARISON:  Chest x-ray of June 17, 2010 obtained as part of an abdominal series. FINDINGS: The lungs remain hyperinflated. There is no focal infiltrate. There is no  pleural effusion or pneumothorax. The heart and pulmonary vascularity are normal. The mediastinum is normal in width. There is calcification in the wall of the aortic arch. There is gentle dextrocurvature centered at approximately T9. IMPRESSION: Chronic bronchitic changes.  No alveolar pneumonia nor CHF. Thoracic aortic atherosclerosis. Electronically Signed   By: David  [place] M.D.   On: 01/27/2018 10:14   Nm Myocar Multi W/spect W/wall Motion / Ef  Result Date: 01/28/2018  There was no ST segment deviation noted during stress.  Very poor images due to significant extracardiac uptake. Overall grossly normal perfusion.  The left ventricular ejection fraction is hyperdynamic (>65%).  Nuclear stress EF: 86%.  This is a low risk study.      Subjective: Feeling better, no new complaints.  General = no fevers, chills, dizziness, malaise, fatigue HEENT/EYES = negative for pain, redness, loss of vision, double vision, blurred vision, loss of hearing, sore throat, hoarseness, dysphagia Cardiovascular= negative for chest pain, palpitation,  murmurs, lower extremity swelling Respiratory/lungs= negative for shortness of breath, cough, hemoptysis, wheezing, mucus production Gastrointestinal= negative for nausea, vomiting,, abdominal pain, melena, hematemesis Genitourinary= negative for Dysuria, Hematuria, Change in Urinary Frequency MSK = Negative for arthralgia, myalgias, Back Pain, Joint swelling  Neurology= Negative for headache, seizures, numbness, tingling  Psychiatry= Negative for anxiety, depression, suicidal and homocidal ideation Allergy/Immunology= Medication/Food allergy as listed  Skin= Negative for Rash, lesions, ulcers, itching   Discharge Exam: Vitals:   01/28/18 1343 01/28/18 1345  BP: (!) 157/75 (!) 148/73  Pulse:    Resp:    Temp:    SpO2:     Vitals:   01/28/18 1202 01/28/18 1341 01/28/18 1343 01/28/18 1345  BP: 133/72 134/75 (!) 157/75 (!) 148/73  Pulse:      Resp:       Temp:      TempSrc:      SpO2:        General: Pt is alert, awake, not in acute distress Cardiovascular: RRR, S1/S2 +, no rubs, no gallops Respiratory: CTA bilaterally, no wheezing, no rhonchi Abdominal: Soft, NT, ND, bowel sounds + Extremities: no edema, no cyanosis    The results of significant diagnostics from this hospitalization (including imaging, microbiology, ancillary and laboratory) are listed below for reference.     Microbiology: No results found for this or any previous visit (from the past 240 hour(s)).   Labs: BNP (last 3 results) No results for input(s): BNP in the last 8760 hours. Basic Metabolic Panel: Recent Labs  Lab 01/27/18 1500  NA 137  K 3.7  CL 107  CO2 23  GLUCOSE 88  BUN 13  CREATININE 1.06*  CALCIUM 9.1   Liver Function Tests: No results for input(s): AST, ALT, ALKPHOS, BILITOT, PROT, ALBUMIN in the last 168 hours. No results for input(s): LIPASE, AMYLASE in the last 168 hours. No results for input(s): AMMONIA in the last 168 hours. CBC: Recent Labs  Lab 01/27/18 1437  WBC 3.7*  HGB 11.9*  HCT 36.0  MCV 94.2  PLT 213   Cardiac Enzymes: Recent Labs  Lab 01/27/18 1651 01/27/18 2330 01/28/18 0512  TROPONINI <0.03 <0.03 <0.03   BNP: Invalid input(s): POCBNP CBG: No results for input(s): GLUCAP in the last 168 hours. D-Dimer No results for input(s): DDIMER in the last 72 hours. Hgb A1c Recent Labs    01/28/18 0803  HGBA1C 5.4   Lipid Profile Recent Labs    01/28/18 0803  CHOL 269*  HDL 94  LDLCALC 163*  TRIG 60  CHOLHDL 2.9   Thyroid function studies Recent Labs    01/28/18 0803  TSH 1.412   Anemia work up Recent Labs    01/27/18 2330  VITAMINB12 929*   Urinalysis    Component Value Date/Time   COLORURINE YELLOW 01/27/2018 1408   APPEARANCEUR CLOUDY (A) 01/27/2018 1408   LABSPEC 1.021 01/27/2018 1408   PHURINE 5.0 01/27/2018 1408   GLUCOSEU NEGATIVE 01/27/2018 1408   HGBUR NEGATIVE  01/27/2018 1408   BILIRUBINUR NEGATIVE 01/27/2018 1408   KETONESUR NEGATIVE 01/27/2018 1408   PROTEINUR NEGATIVE 01/27/2018 1408   UROBILINOGEN 0.2 06/13/2014 0630   NITRITE NEGATIVE 01/27/2018 1408   LEUKOCYTESUR NEGATIVE 01/27/2018 1408   Sepsis Labs Invalid input(s): PROCALCITONIN,  WBC,  LACTICIDVEN Microbiology No results found for this or any previous visit (from the past 240 hour(s)).   Time coordinating discharge:  I have spent 35 minutes face to face with the patient and on the ward discussing the patients  care, assessment, plan and disposition with other care givers. >50% of the time was devoted counseling the patient about the risks and benefits of treatment/Discharge disposition and coordinating care.   SIGNED:   Dimple Nanas, MD  [facility] 01/28/2018, 5:30 PM Pager   If 7PM-7AM, please contact night-coverage [email or web address] Password [email or web address]

## 2018-01-28 NOTE — Progress Notes (Signed)
   Natalie Boyd presented for a nuclear stress test today.  No immediate complications.  Stress imaging is pending at this time.  Preliminary EKG findings may be listed in the chart, but the stress test result will not be finalized until perfusion imaging is complete.   Georgie Chard, NP-C 01/28/2018, 1:51 PM

## 2018-01-28 NOTE — Progress Notes (Signed)
Pt remains at stress testing lab 

## 2018-01-28 NOTE — Progress Notes (Signed)
Pt remains at stress testing

## 2018-01-28 NOTE — Consult Note (Addendum)
Cardiology Consult    Patient ID: Natalie Boyd MRN: [id number], DOB/AGE: March 26, 1951   Admit date: 01/27/2018 Date of Consult: 01/28/2018  Primary Physician: Andi Devon, MD Primary Cardiologist: New Consult (Dr. Weston Brass) Requesting Provider: Dr. Nelson Chimes  Patient Profile    Natalie Boyd is a 67 y.o. female with a history of hypertension and hypertension, who is being seen today for the evaluation of chest pain at the request of Dr. Nelson Chimes.  History of Present Illness    Patient has no known history of CAD and does not see a Development worker, international aid. She reportedly had a stress test several years ago in [place] but cannot remember why the test was performed. Patient states she only walked on the treadmill for 5 minutes and then got off because she does not exercise. Results of study unknown.   Patient was in her usual state of health until Sunday (01/25/2018) when she developed an episode of chest pain. Patient states she was getting ready to go to church when she started having central chest pain that radiated to her left shoulder and down her arm. The patient states the pain gradually intensified and then gradually improved after about 1 hours. She had a similar episode of pain yesterday while in the car. Again, the pain radiated to her left shoulder and down her left arm and this time she noted some left arm tingling. Patient described the pain as a "peculiar" feeling and states she just did not feel good and the pain "slowed her down." She reports associated nausea (mild) and weakness with the pain but denies any diaphoresis, shortness of breath, vomiting, lightheadedness or dizziness. She states she has never had this pain before. She denies any history of exertional chest pain or dyspnea although she is not very active. Patient also denies any history of orthopnea or PND. She does note some swelling in her feet (left more than the right) when she eats a lot of salt.   Upon arrival to  the ED yesterday, vitals stable. EKG showed normal sinus rhythm, rate of 63 bpm, with no acute ST changes. I-stat troponin was negative. Chest x-ray showed chronic bronchitis changes but no alveolar pneumonia or CHF. WBC 3.7, Hgb 11.9, Plts 213. Na 137, K 3.7, Glucose 88, SCr 1.06. TSH normal at 1.450.   Currently, patient is completely asymptomatic and denies any chest pain or shortness of breath.   Of note, patient states she has had "memory issues" that have worsened over the last 2-3 years. As a result, she was not able to give a very detailed description/account of her chest pain. Patient often forgets to take her blood pressure medications. Patient seems very worried and stressed that she cannot remember things.   Past Medical History   Past Medical History:  Diagnosis Date  . Colitis   . Hyperlipidemia   . Hypertension   . Pancreatitis   . Reflux     History reviewed. No pertinent surgical history.   Allergies  Allergies  Allergen Reactions  . Penicillins     Other reaction(s): Other (See Comments) No reaction listed.   . Codeine   . Doxycycline   . Erythromycin   . Other     Other reaction(s): Other (See Comments) Allergic to all "mycins"- No reaction listed.   . Soy Allergy Diarrhea  . Sulfa Antibiotics     Inpatient Medications    . carbamazepine  200 mg Oral BID  . enoxaparin (LOVENOX) injection  40 mg Subcutaneous  Q24H  . hydroxychloroquine  200 mg Oral BID  . irbesartan  75 mg Oral Daily  . sodium chloride flush  3 mL Intravenous Q12H    Family History    No family history on file. has no family status information on file.    Social History    Social History   Socioeconomic History  . Marital status: Married    Spouse name: Not on file  . Number of children: Not on file  . Years of education: Not on file  . Highest education level: Not on file  Occupational History  . Not on file  Social Needs  . Financial resource strain: Not on file  .  Food insecurity:    Worry: Not on file    Inability: Not on file  . Transportation needs:    Medical: Not on file    Non-medical: Not on file  Tobacco Use  . Smoking status: Never Smoker  Substance and Sexual Activity  . Alcohol use: No  . Drug use: No  . Sexual activity: Not on file  Lifestyle  . Physical activity:    Days per week: Not on file    Minutes per session: Not on file  . Stress: Not on file  Relationships  . Social connections:    Talks on phone: Not on file    Gets together: Not on file    Attends religious service: Not on file    Active member of club or organization: Not on file    Attends meetings of clubs or organizations: Not on file    Relationship status: Not on file  . Intimate partner violence:    Fear of current or ex partner: Not on file    Emotionally abused: Not on file    Physically abused: Not on file    Forced sexual activity: Not on file  Other Topics Concern  . Not on file  Social History Narrative  . Not on file     Review of Systems    Review of Systems  Constitutional: Negative for chills, diaphoresis, fever and malaise/fatigue.  HENT: Negative for congestion.   Respiratory: Negative for cough, hemoptysis, sputum production and shortness of breath.   Cardiovascular: Positive for chest pain and leg swelling. Negative for orthopnea and PND.  Gastrointestinal: Positive for nausea. Negative for abdominal pain, blood in stool, constipation, melena and vomiting.  Genitourinary: Negative for hematuria.  Musculoskeletal: Negative for myalgias.  Neurological: Positive for tingling (left arm) and weakness. Negative for dizziness, loss of consciousness and headaches.  Endo/Heme/Allergies: Negative for environmental allergies. Does not bruise/bleed easily.  Psychiatric/Behavioral: Positive for memory loss ("memory issues" x2-3 years). Negative for substance abuse.    Physical Exam    Blood pressure (!) 106/56, pulse 63, temperature 98.3 F  (36.8 C), temperature source Oral, resp. rate 18, SpO2 100 %.  General: 67 y.o. thin African-American female resting comfortably in no acute distress. Pleasant and cooperative. HEENT: Normal.  Neck: Supple. No bruits or JVD appreciated. Lungs: No increased work of breathing. Clear to auscultation bilaterally. No wheezes, rhonchi, or rales. Heart: RRR. Distinct S1 and S2. No murmurs, gallops, or rubs.  Abdomen: Soft, non-distended, and non-tender to palpation. Bowel sounds present in all 4 quadrants.   Extremities: No lower extremity edema. Distal pedal pulses and radial pulses 2+ and equal bilaterally. Neuro: Alert and oriented x3. No focal deficits. Moves all extremities spontaneously. Psych: Normal affect.  Labs    Troponin [facility] of Care Test) Recent  Labs    01/27/18 1321  TROPIPOC 0.01   Recent Labs    01/27/18 1651 01/27/18 2330 01/28/18 0512  TROPONINI <0.03 <0.03 <0.03   Lab Results  Component Value Date   WBC 3.7 (L) 01/27/2018   HGB 11.9 (L) 01/27/2018   HCT 36.0 01/27/2018   MCV 94.2 01/27/2018   PLT 213 01/27/2018    Recent Labs  Lab 01/27/18 1500  NA 137  K 3.7  CL 107  CO2 23  BUN 13  CREATININE 1.06*  CALCIUM 9.1  GLUCOSE 88   No results found for: CHOL, HDL, LDLCALC, TRIG No results found for: [facility]   Radiology Studies    Dg Chest 2 View  Result Date: 01/27/2018 CLINICAL DATA:  Two day history of intermittent nonradiating left-sided chest pain with increased severity this morning. No previous episodes. Patient also reports tingling sensation in the left arm. EXAM: CHEST - 2 VIEW COMPARISON:  Chest x-ray of June 17, 2010 obtained as part of an abdominal series. FINDINGS: The lungs remain hyperinflated. There is no focal infiltrate. There is no pleural effusion or pneumothorax. The heart and pulmonary vascularity are normal. The mediastinum is normal in width. There is calcification in the wall of the aortic arch. There is gentle dextrocurvature  centered at approximately T9. IMPRESSION: Chronic bronchitic changes.  No alveolar pneumonia nor CHF. Thoracic aortic atherosclerosis. Electronically Signed   By: David  [place] M.D.   On: 01/27/2018 10:14    EKG     EKG: All of the EKGs below were personally reviewed: - Initial EKG in the ED yesterday on 01/27/2018 demonstrated: normal sinus rhythm, rate 63 bpm, with no acute ST changes. - EKG this morning on 01/28/2018 demonstrated: normal sinus rhythm, rate 64 bpm, with no acute ST changes.  Telemetry: Telemetry was personally reviewed and demonstrates: normal sinus rhythm/sinus tachycardia, rate ranging between 60s to low 120s bpm, with PVCs.  Cardiac Imaging    None.  Assessment & Plan    1. Chest Pain - Patient presented with central chest pain that radiated to her left shoulder and arm with associated left arm tingling.  - EKG in ED showed no acute ST changes.  - Troponin negative x3.  - Lipid panel pending. - Hgb A1c pending.  - Patient not currently having any angina.  - Patient is a relatively poor historian due to her "memory issues." Given patient's presentation and cardiovascular risk factor (hypertension and hyperlipidemia) as well as her medication non-compliance, patient may benefit from an ischemic workup with a stress test. Will discuss with MD.  2. Hypertension - Most recent BP 106/56.  - Patient takes Candesartan 16mg  daily at home but states she often forgets to take her medication.  - Continue ARB.   3. Hyperlipidemia  - Lipid panel pending. - Not currently on a statin at home.  Signed, Corrin Parker, PA-C 01/28/2018, 7:22 AM  For questions or updates, please contact   Please consult [email or web address] for contact info under Cardiology/STEMI. ------------------------------------------------------------------------------   History and all data above reviewed.  Patient examined.  I agree with the findings as above.  Arvilla Meres is currently chest  pain free.   Constitutional: No acute distress Eyes: pupils equally round and reactive to light, sclera non-icteric, normal conjunctiva and lids ENMT: normal dentition, moist mucous membranes Cardiovascular: regular rhythm, normal rate, no murmurs. S1 and S2 normal. Radial pulses normal bilaterally. No jugular venous distention.  Respiratory: clear to auscultation bilaterally GI : normal bowel sounds,  soft and nontender. No distention.   MSK: extremities warm, well perfused. No edema.  NEURO: grossly nonfocal exam, moves all extremities. PSYCH: alert and oriented x 3, normal mood and affect.   All available labs, radiology testing, previous records reviewed. Agree with documented assessment and plan of my colleague as stated above with the following additions or changes:  Active Problems:   Chest pain   Plan:  She has ruled out for acute MI, however, her symptoms warrant evaluation with stress imaging today.  I would like for her to attempt to walk on the treadmill for her stress test, however we can converted to a regadenoson Myoview (tetrofosmin) stress test if peak stress not achieved.  No other changes are required to her medication therapy at this time, remainder of her cardiovascular risk factor modification can be performed as an outpatient.   Parke Poisson, MD [facility] 10:46 AM  01/28/2018

## 2018-01-28 NOTE — Progress Notes (Signed)
Pt alert and oriented in NAD. Pt verbalized understanding of discharge instructions. 

## 2018-01-28 NOTE — Progress Notes (Signed)
Pt transported to nuc med  

## 2018-06-15 ENCOUNTER — Other Ambulatory Visit: Payer: Self-pay | Admitting: Internal Medicine

## 2018-06-15 DIAGNOSIS — R109 Unspecified abdominal pain: Secondary | ICD-10-CM

## 2018-06-22 ENCOUNTER — Other Ambulatory Visit: Payer: BC Managed Care – PPO

## 2018-09-03 ENCOUNTER — Telehealth: Payer: Self-pay | Admitting: Interventional Cardiology

## 2018-09-03 NOTE — Telephone Encounter (Signed)
Patient stopped into the office said they were personal friends of Dr Katrinka Blazing, patient is looking for recommendations she is having memory issues, and feels they are rather severe.  She said she is going to call Dr Ival Bible at home , just wanted to give him a heads up.

## 2018-11-03 ENCOUNTER — Other Ambulatory Visit: Payer: Self-pay | Admitting: Internal Medicine

## 2018-11-04 ENCOUNTER — Other Ambulatory Visit: Payer: Self-pay | Admitting: Internal Medicine

## 2018-11-04 DIAGNOSIS — R102 Pelvic and perineal pain: Secondary | ICD-10-CM

## 2018-12-24 ENCOUNTER — Encounter: Payer: Self-pay | Admitting: Neurology

## 2018-12-24 ENCOUNTER — Other Ambulatory Visit: Payer: Self-pay

## 2018-12-24 ENCOUNTER — Ambulatory Visit: Payer: BC Managed Care – PPO | Admitting: Neurology

## 2018-12-24 VITALS — BP 121/73 | HR 73 | Ht 67.0 in | Wt 133.0 lb

## 2018-12-24 DIAGNOSIS — K51811 Other ulcerative colitis with rectal bleeding: Secondary | ICD-10-CM

## 2018-12-24 DIAGNOSIS — R413 Other amnesia: Secondary | ICD-10-CM

## 2018-12-24 DIAGNOSIS — G309 Alzheimer's disease, unspecified: Secondary | ICD-10-CM

## 2018-12-24 DIAGNOSIS — G308 Other Alzheimer's disease: Secondary | ICD-10-CM

## 2018-12-24 DIAGNOSIS — M359 Systemic involvement of connective tissue, unspecified: Secondary | ICD-10-CM | POA: Diagnosis not present

## 2018-12-24 DIAGNOSIS — F0280 Dementia in other diseases classified elsewhere without behavioral disturbance: Secondary | ICD-10-CM

## 2018-12-24 NOTE — Progress Notes (Addendum)
Provider:  Melvyn Novas, M D  Referring Provider: Andi Devon, MD Primary Care Physician:  Andi Devon, MD  Chief Complaint  Patient presents with  . New Patient (Initial Visit)    pt with daughter, rm 36.  She is a new patient in the process of moving house-  she presents because of memory concerns which have worsened.     HPI:  Natalie Boyd is a 68 y.o. female  Is seen here as a referral/ revisit  from Dr. Renae Gloss for Memory Loss.  Natalie Boyd is a 67 year old african-american right handed female patient with a concern of memory loss, appearing slightly agitated.  She is on multiple medications that she is not sure what she takes them for? Plaquenil and Tegretol.  She has had pancreatitis, long hospitalizations. Ulcerative colitis.   She reports noting memory decline for short term memory for 3-5 years, initially could not find her doctor receptive she reports.  Her daughter is here with her, right now helping her with moving house. Daughter has lupus.  She reports her mother's friends have called and reported noting cognitive decline, a change in personality. A cousin also reported this to the patient's daughter. She has never been evaluated in detail as far as she recalls.  Reportedly she has not gotten lost when driving, but daughter is not sure that's true, husband does all the driving.  She has never handeled the finances, her husband does it all and always did.  She may forget sometimes medication in time, or taking a second a dose. She refuses to use a pillbox.  No trouble with sleep, no loss of apetite . She is and was always skinny.   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  Married,  One daughter - non smoker, non drinker, no caffeine- none.   Paternal aunt Veva Holes had Alzheimer's.   Review of Systems: Out of a complete 14 system review, the patient complains of only the following  symptoms, and all other reviewed systems are negative. short term memory loss.   Social History   Socioeconomic History  . Marital status: Married    Spouse name: Not on file  . Number of children: Not on file  . Years of education: Not on file  . Highest education level: Not on file  Occupational History  . Not on file  Social Needs  . Financial resource strain: Not on file  . Food insecurity    Worry: Not on file    Inability: Not on file  . Transportation needs    Medical: Not on file    Non-medical: Not on file  Tobacco Use  . Smoking status: Never Smoker  . Smokeless tobacco: Never Used  Substance and Sexual Activity  . Alcohol use: No  . Drug use: No  . Sexual activity: Not on file  Lifestyle  . Physical activity    Days per week: Not on file    Minutes per session: Not on file  . Stress: Not on file  Relationships  . Social Musician on phone: Not on file    Gets together: Not on file    Attends religious service: Not on file    Active member of club or organization: Not on file    Attends meetings of clubs or organizations: Not on file    Relationship status: Not on file  . Intimate  partner violence    Fear of current or ex partner: Not on file    Emotionally abused: Not on file    Physically abused: Not on file    Forced sexual activity: Not on file  Other Topics Concern  . Not on file  Social History Narrative  . Not on file    Family History  Problem Relation Age of Onset  . Hypertension Father   . Heart attack Father     Past Medical History:  Diagnosis Date  . Colitis   . Hyperlipidemia   . Hypertension   . Pancreatitis   . Reflux     No past surgical history on file.  Current Outpatient Medications  Medication Sig Dispense Refill  . carbamazepine (CARBATROL) 200 MG 12 hr capsule Take 200 mg by mouth 2 (two) times daily.  11  . nortriptyline (PAMELOR) 25 MG capsule Take 25 mg by mouth daily.    . rosuvastatin (CRESTOR)  10 MG tablet Take 10 mg by mouth daily.    . candesartan (ATACAND) 16 MG tablet Take 1 tablet (16 mg total) by mouth daily. 30 tablet 0   No current facility-administered medications for this visit.     Allergies as of 12/24/2018 - Review Complete 12/24/2018  Allergen Reaction Noted  . Penicillins  06/18/2010  . Codeine  06/18/2010  . Doxycycline  06/18/2010  . Erythromycin  06/18/2010  . Other    . Soy allergy Diarrhea 01/27/2018  . Sulfa antibiotics  06/18/2010    Vitals: BP 121/73   Pulse 73   Ht 5\' 7"  (1.702 m)   Wt 133 lb (60.3 kg)   BMI 20.83 kg/m  Last Weight:  Wt Readings from Last 1 Encounters:  12/24/18 133 lb (60.3 kg)   Last Height:   Ht Readings from Last 1 Encounters:  12/24/18 5\' 7"  (1.702 m)    Physical exam:  General: The patient is awake, alert and appears not in acute distress. The patient is well groomed. Head: Normocephalic, atraumatic. Neck is supple.  Cardiovascular:  Regular rate and rhythm, without  murmurs or carotid bruit, and without distended neck veins. Respiratory: Lungs are clear to auscultation. Skin:  Without evidence of edema, or rash Trunk: patient  has normal posture.  Neurologic exam : The patient is awake and alert, oriented to place and time.   Memory subjective   described as impaired ;  MMSE - Mini Mental State Exam 12/24/2018  Orientation to time 3  Orientation to Place 1  Registration 3  Attention/ Calculation 5  Recall 0  Language- name 2 objects 2  Language- repeat 1  Language- follow 3 step command 2  Language- read & follow direction 1  Write a sentence 1  Copy design 0  Total score 19       There is a normal attention span & concentration ability. Speech is fluent without  dysarthria, dysphonia or aphasia. Mood and affect are appropriate.  Cranial nerves: Pupils are equal and briskly reactive to light. Funduscopic exam without  evidence of pallor or edema. Extraocular movements  in vertical and  horizontal planes intact and without nystagmus.  Visual fields by finger perimetry are intact. Hearing to finger rub intact.   Facial sensation intact to fine touch. Facial motor strength is symmetric and tongue and uvula move midline. Tongue protrusion into either cheek is normal. Shoulder shrug is normal.   Motor exam:  Normal tone ,muscle bulk and symmetric  strength in all extremities. Sensory:  Fine touch, pinprick and vibration were tested in all extremities. Proprioception was normal.  Coordination: Rapid alternating movements in the fingers/hands were normal. Finger-to-nose maneuver  normal without evidence of ataxia, dysmetria or tremor.  Gait and station: Patient walks without assistive device and is able unassisted to climb up to the exam table. Strength within normal limits. Stance is stable and normal. Turns with 3 steps- fluent movement, no drift,  Tandem gait is unfragmented. Romberg testing is negative   Deep tendon reflexes: in the upper and lower extremities are symmetric and intact. Babinski maneuver deferred/ downgoing.   Assessment:  After physical and neurologic examination, review of laboratory studies, imaging, neurophysiology testing and pre-existing records, assessment is that of :  MMSE indicative of progressed cognitive decline- Alzheimer's,   She has no indication of parkinsonism, no Lewy body dementia- hallucinations. No frontal lobe symptoms.   Plan:  Treatment plan and additional workup :  Stop Nortriptyline, Stop Plaquenil - not sure why you would be on it.  Tegretol/ Carbamazepine may also affect memory.  It was supposingly treating trigeminial face pain- tic dolorouse   MRI brain Lab test. Aricept 5 mg a day.   Rv with Np or me in 3 -4  month   Larey Seat MD 12/24/2018

## 2018-12-24 NOTE — Patient Instructions (Signed)

## 2018-12-28 ENCOUNTER — Telehealth: Payer: Self-pay | Admitting: Neurology

## 2018-12-28 NOTE — Telephone Encounter (Signed)
Medicare/BCBS Josem Kaufmann: [phone] (exp. 12/28/18 to 06/25/19) order sent to GI. They will reach out to the patient to schedule.
